# Patient Record
Sex: Male | Born: 1980 | Race: White | Hispanic: Yes | Marital: Single | Smoking: Never smoker
Health system: Southern US, Community
[De-identification: ages and names within clinical notes are randomized; demographics above are authoritative.]

## PROBLEM LIST (undated history)

## (undated) DIAGNOSIS — N200 Calculus of kidney: Secondary | ICD-10-CM

## (undated) HISTORY — PX: APPENDECTOMY: SHX54

---

## 2007-01-16 ENCOUNTER — Observation Stay (HOSPITAL_COMMUNITY): Admission: EM | Admit: 2007-01-16 | Discharge: 2007-01-17 | Payer: Self-pay | Admitting: Emergency Medicine

## 2007-01-16 ENCOUNTER — Encounter (INDEPENDENT_AMBULATORY_CARE_PROVIDER_SITE_OTHER): Payer: Self-pay | Admitting: General Surgery

## 2010-08-09 NOTE — Op Note (Signed)
NAMEHENDRICK, Ronnie Daniel              ACCOUNT NO.:  [id number]   MEDICAL RECORD NO.:  [id number]          Daniel TYPE:  OBV   LOCATION:  A321                          FACILITY:  [facility]   PHYSICIAN:  Dalia Heading, M.D.  DATE OF BIRTH:  March 10, 1981   DATE OF PROCEDURE:  01/16/2007  DATE OF DISCHARGE:  01/17/2007                               OPERATIVE REPORT   PREOPERATIVE DIAGNOSIS:  Acute appendicitis.   POSTOPERATIVE DIAGNOSIS:  Acute appendicitis.   PROCEDURE:  Laparoscopic appendectomy.   SURGEON:  Dr. Franky Macho.   ANESTHESIA:  General endotracheal.   INDICATIONS:  Ronnie Daniel is a 30 year old Hispanic male who presents  with acute appendicitis to Ronnie emergency room.  Ronnie risks and benefits  of Ronnie procedure including bleeding, infection, of Ronnie possibility of an  open procedure were fully explained to Ronnie Daniel through an  interpreter, who gave informed consent.   PROCEDURE NOTE:  Ronnie Daniel is placed in Ronnie supine position.  After  induction of general endotracheal anesthesia, Ronnie abdomen prepped and  draped in Ronnie usual sterile technique with Betadine.  Surgical site  confirmation was performed.   A supraumbilical incision was made down to Ronnie fascia.  A Veress needle  was introduced into Ronnie abdominal cavity and confirmation of placement  was done using Ronnie saline drop test.  Ronnie abdomen was then insufflated  to 16 mmHg pressure.  11 mm trocar was introduced into Ronnie abdominal  cavity under direct visualization without difficulty.  Ronnie Daniel was  placed in deeper Trendelenburg position and 12-mm trocar was placed in  suprapubic region and a 5-mm trocar was placed left lower quadrant  region.  Ronnie appendix was visualized and noted to be acutely inflamed.  Ronnie mesoappendix was divided using Ronnie harmonic scalpel.  Ronnie standard  Endo-GIA was placed across Ronnie base of Ronnie appendix and fired.  Ronnie  appendix was removed using an EndoCatch bag.  Ronnie staple line was  inspected, noted be within normal limits.  All fluid and air were then  evacuated from Ronnie abdominal cavity prior to removal of trocars.   All wounds were irrigated normal saline.  All wounds were checked with  0.5%  Sensorcaine.  Ronnie supraumbilical fascia as well as suprapubic  fascia reapproximated using 0 Vicryl interrupted sutures.  All skin  incisions were closed using staples.  Betadine ointment, dry sterile  dressings were applied.   All tape and needle counts correct at end of Ronnie procedure.  Ronnie Daniel  was extubated in Ronnie operating room, went back to recovery room awake in  stable condition.   COMPLICATIONS:  None.   SPECIMEN:  Appendix.   BLOOD LOSS:  Minimal.      Dalia Heading, M.D.  Electronically Signed     MAJ/MEDQ  D:  01/16/2007  T:  01/17/2007  Job:  [id number]

## 2011-01-04 LAB — COMPREHENSIVE METABOLIC PANEL
ALT: 24
AST: 27
Albumin: 4.5
BUN: 9
CO2: 34 — ABNORMAL HIGH
Creatinine, Ser: 0.86
GFR calc non Af Amer: >60
Glucose, Bld: 151 — ABNORMAL HIGH
Total Bilirubin: 0.9

## 2011-01-04 LAB — DIFFERENTIAL
Eosinophils Relative: 0
Lymphocytes Relative: 2 — ABNORMAL LOW
Lymphs Abs: 0.5 — ABNORMAL LOW
Monocytes Absolute: 0.7
Neutro Abs: 22.1 — ABNORMAL HIGH
Neutrophils Relative %: 95 — ABNORMAL HIGH

## 2011-01-04 LAB — URINE MICROSCOPIC-ADD ON

## 2011-01-04 LAB — URINALYSIS, ROUTINE W REFLEX MICROSCOPIC
Bilirubin Urine: NEGATIVE
Glucose, UA: NEGATIVE
Ketones, ur: NEGATIVE
Protein, ur: 30 — AB
Urobilinogen, UA: 0.2

## 2011-01-04 LAB — CBC
Platelets: 209
WBC: 23.4 — ABNORMAL HIGH

## 2011-01-04 LAB — LIPASE, BLOOD: Lipase: 13

## 2016-04-16 DIAGNOSIS — R945 Abnormal results of liver function studies: Secondary | ICD-10-CM | POA: Insufficient documentation

## 2016-04-16 DIAGNOSIS — K819 Cholecystitis, unspecified: Secondary | ICD-10-CM | POA: Insufficient documentation

## 2016-04-17 ENCOUNTER — Emergency Department (HOSPITAL_COMMUNITY): Payer: Self-pay

## 2016-04-17 ENCOUNTER — Other Ambulatory Visit: Payer: Self-pay | Admitting: Internal Medicine

## 2016-04-17 ENCOUNTER — Emergency Department (HOSPITAL_COMMUNITY)
Admission: EM | Admit: 2016-04-17 | Discharge: 2016-04-17 | Disposition: A | Discharge status: Home / Self Care | Payer: Self-pay | Attending: Emergency Medicine | Admitting: Emergency Medicine

## 2016-04-17 ENCOUNTER — Encounter (HOSPITAL_COMMUNITY): Payer: Self-pay | Admitting: Emergency Medicine

## 2016-04-17 ENCOUNTER — Other Ambulatory Visit: Payer: Self-pay

## 2016-04-17 ENCOUNTER — Telehealth: Payer: Self-pay

## 2016-04-17 DIAGNOSIS — R109 Unspecified abdominal pain: Secondary | ICD-10-CM

## 2016-04-17 DIAGNOSIS — R1011 Right upper quadrant pain: Secondary | ICD-10-CM

## 2016-04-17 DIAGNOSIS — R945 Abnormal results of liver function studies: Secondary | ICD-10-CM

## 2016-04-17 DIAGNOSIS — R7989 Other specified abnormal findings of blood chemistry: Secondary | ICD-10-CM

## 2016-04-17 LAB — COMPREHENSIVE METABOLIC PANEL
ALBUMIN: 4.6 g/dL (ref 3.5–5.0)
ALK PHOS: 104 U/L (ref 38–126)
ALT: 564 U/L — AB (ref 17–63)
AST: 829 U/L — AB (ref 15–41)
Anion gap: 9 (ref 5–15)
BUN: 17 mg/dL (ref 6–20)
CALCIUM: 9.8 mg/dL (ref 8.9–10.3)
CHLORIDE: 101 mmol/L (ref 101–111)
CO2: 29 mmol/L (ref 22–32)
CREATININE: 0.95 mg/dL (ref 0.61–1.24)
GFR calc non Af Amer: >60 mL/min (ref >60)
GLUCOSE: 196 mg/dL — AB (ref 65–99)
Potassium: 3.6 mmol/L (ref 3.5–5.1)
SODIUM: 139 mmol/L (ref 135–145)
Total Bilirubin: 1.6 mg/dL — ABNORMAL HIGH (ref 0.3–1.2)
Total Protein: 8.7 g/dL — ABNORMAL HIGH (ref 6.5–8.1)

## 2016-04-17 LAB — CBC WITH DIFFERENTIAL/PLATELET
BASOS ABS: 0 10*3/uL (ref 0.0–0.1)
BASOS PCT: 0 %
EOS ABS: 0 10*3/uL (ref 0.0–0.7)
EOS PCT: 0 %
HCT: 47.5 % (ref 39.0–52.0)
HEMOGLOBIN: 16.4 g/dL (ref 13.0–17.0)
LYMPHS ABS: 0.9 10*3/uL (ref 0.7–4.0)
Lymphocytes Relative: 6 %
MCH: 30.2 pg (ref 26.0–34.0)
MCHC: 34.5 g/dL (ref 30.0–36.0)
MCV: 87.5 fL (ref 78.0–100.0)
Monocytes Absolute: 0.5 10*3/uL (ref 0.1–1.0)
Monocytes Relative: 4 %
NEUTROS PCT: 90 %
Neutro Abs: 13 10*3/uL — ABNORMAL HIGH (ref 1.7–7.7)
PLATELETS: 250 10*3/uL (ref 150–400)
RBC: 5.43 MIL/uL (ref 4.22–5.81)
RDW: 12.7 % (ref 11.5–15.5)
WBC: 14.5 10*3/uL — AB (ref 4.0–10.5)

## 2016-04-17 LAB — URINALYSIS, ROUTINE W REFLEX MICROSCOPIC
BACTERIA UA: NONE SEEN
GLUCOSE, UA: 50 mg/dL — AB
HGB URINE DIPSTICK: NEGATIVE
KETONES UR: NEGATIVE mg/dL
Leukocytes, UA: NEGATIVE
NITRITE: NEGATIVE
PROTEIN: 100 mg/dL — AB
Specific Gravity, Urine: 1.03 (ref 1.005–1.030)
WBC UA: NONE SEEN WBC/hpf (ref 0–5)
pH: 7 (ref 5.0–8.0)

## 2016-04-17 LAB — ACETAMINOPHEN LEVEL

## 2016-04-17 LAB — PROTIME-INR
INR: 0.98
Prothrombin Time: 13 seconds (ref 11.4–15.2)

## 2016-04-17 LAB — LIPASE, BLOOD: LIPASE: 19 U/L (ref 11–51)

## 2016-04-17 LAB — I-STAT CG4 LACTIC ACID, ED
Lactic Acid, Venous: 2.65 mmol/L (ref 0.5–1.9)
Lactic Acid, Venous: 1.72 mmol/L (ref 0.5–1.9)

## 2016-04-17 MED ORDER — ONDANSETRON HCL 4 MG/2ML IJ SOLN
4.0000 mg | Freq: Once | INTRAMUSCULAR | Status: AC
  Administered 2016-04-17: 4 mg via INTRAVENOUS
  Filled 2016-04-17: qty 2

## 2016-04-17 MED ORDER — DEXTROSE 5 % IV SOLN
INTRAVENOUS | Status: AC
  Filled 2016-04-17: qty 2

## 2016-04-17 MED ORDER — SODIUM CHLORIDE 0.9 % IV BOLUS (SEPSIS)
1000.0000 mL | Freq: Once | INTRAVENOUS | Status: AC
  Administered 2016-04-17: 1000 mL via INTRAVENOUS

## 2016-04-17 MED ORDER — DEXTROSE 5 % IV SOLN
2.0000 g | Freq: Once | INTRAVENOUS | Status: AC
  Administered 2016-04-17: 2 g via INTRAVENOUS
  Filled 2016-04-17: qty 2

## 2016-04-17 MED ORDER — ONDANSETRON HCL 8 MG PO TABS: 8.0000 mg | ORAL_TABLET | ORAL | 0 refills | Status: DC | PRN

## 2016-04-17 MED ORDER — FENTANYL CITRATE (PF) 100 MCG/2ML IJ SOLN
50.0000 ug | Freq: Once | INTRAMUSCULAR | Status: AC
  Administered 2016-04-17: 50 ug via INTRAVENOUS
  Filled 2016-04-17: qty 2

## 2016-04-17 MED ORDER — IOPAMIDOL (ISOVUE-300) INJECTION 61%
100.0000 mL | Freq: Once | INTRAVENOUS | Status: AC | PRN
  Administered 2016-04-17: 100 mL via INTRAVENOUS

## 2016-04-17 NOTE — ED Notes (Signed)
Patient states he does not need anything at this time. 

## 2016-04-17 NOTE — Consult Note (Signed)
SURGICAL CONSULTATION NOTE (initial) - cpt: [phone]  HISTORY OF PRESENT ILLNESS (HPI):  36 y.o. male presented to [facility] ED overnight with abdominal pain that began around 4 pm yesterday afternoon after eating what his family describes as the spanish equivalent of a cheesesteak for lunch yesterday. He describes the pain as RUQ associated with nausea and 5 episodes of non-bloody emesis yesterday. He reports flatus and his pain has improved, but he attributes the relief to a single dose of fentanyl that he received ~6 hours ago at 4:20 am. He also reports that his pain since progressed to his mid-lower abdomen and is now only over his LUQ. He currently denies any fever/chills or N/V and denies any current or recent CP or SOB. Of note, this is the second episode he's experienced of RUQ abdominal pain, the first of which was at 3 am nine days ago on Saturday, 1/13.  Patient is spanish-speaking only, and the history above is obtained with the help of a translator, his spanish- and english-speaking family at bedside.  Surgery is consulted by ED physician Dr. Manus Gunningancour in this context for evaluation and management of gallstones.  PAST MEDICAL HISTORY (PMH):  History reviewed. No pertinent past medical history.   PAST SURGICAL HISTORY (PSH):  Past Surgical History:  Procedure Laterality Date  . APPENDECTOMY       MEDICATIONS:  Prior to Admission medications   Not on File     ALLERGIES:  No Known Allergies   SOCIAL HISTORY:  Social History   Social History  . Marital status: Single    Spouse name: N/A  . Number of children: N/A  . Years of education: N/A   Occupational History  . Not on file.   Social History Main Topics  . Smoking status: Never Smoker  . Smokeless tobacco: Never Used  . Alcohol use No     Comment: Former drinker-stopped 3 months ago  . Drug use: No  . Sexual activity: Not on file   Other Topics Concern  . Not on file   Social History Narrative  . No narrative on  file    The patient currently resides (home / rehab facility / nursing home): Home  The patient normally is (ambulatory / bedbound): Ambulatory   FAMILY HISTORY:  No family history on file.   REVIEW OF SYSTEMS:  Constitutional: denies weight loss, fever, chills, or sweats  Eyes: denies any other vision changes, history of eye injury  ENT: denies sore throat, hearing problems  Respiratory: denies shortness of breath, wheezing  Cardiovascular: denies chest pain, palpitations  Gastrointestinal: abdominal pain, N/V, and bowel function as per HPI Genitourinary: denies burning with urination or urinary frequency Musculoskeletal: denies any other joint pains or cramps  Skin: denies any other rashes or skin discolorations  Neurological: denies any other headache, dizziness, weakness  Psychiatric: denies any other depression, anxiety   All other review of systems were negative   VITAL SIGNS:  Temp:  [98.1 F (36.7 C)-98.2 F (36.8 C)] 98.2 F (36.8 C) (01/22 0412) Pulse Rate:  [70-94] 74 (01/22 0930) Resp:  [16-18] 17 (01/22 0600) BP: (101-138)/(69-88) 113/78 (01/22 0930) SpO2:  [97 %-100 %] 99 % (01/22 0930) Weight:  [65.5 kg (144 lb 6.4 oz)] 65.5 kg (144 lb 6.4 oz) (01/22 0127)     Height: 5\' 6"  (167.6 cm) Weight: 65.5 kg (144 lb 6.4 oz) BMI (Calculated): 23.4   INTAKE/OUTPUT:  This shift: No intake/output data recorded.  Last 2 shifts: @IOLAST2SHIFTS @  PHYSICAL EXAM:  Constitutional:  -- Normal body habitus  -- Awake, alert, and oriented x3  Eyes:  -- Pupils equally round and reactive to light  -- No scleral icterus  Ear, nose, and throat:  -- No jugular venous distension  Pulmonary:  -- No crackles  -- Equal breath sounds bilaterally -- Breathing non-labored at rest Cardiovascular:  -- S1, S2 present  -- No pericardial rubs Gastrointestinal:  -- Abdomen soft with only mild LUQ tenderness to palpation, nondistended, no guarding/rebound  -- No abdominal masses  appreciated, pulsatile or otherwise  Musculoskeletal and Integumentary:  -- Wounds or skin discoloration: None appreciated -- Extremities: B/L UE and LE FROM, no edema  Neurologic:  -- Motor function: intact and symmetric -- Sensation: intact and symmetric  Labs:  CBC Latest Ref Rng & Units 04/17/2016 01/16/2007  WBC 4.0 - 10.5 K/uL 14.5(H) 23.4(H)  Hemoglobin 13.0 - 17.0 g/dL 16.1 09.6  Hematocrit 04.5 - 52.0 % 47.5 45.5  Platelets 150 - 400 K/uL 250 209   CMP Latest Ref Rng & Units 04/17/2016 01/16/2007  Glucose 65 - 99 mg/dL 409(W) 119(J)  BUN 6 - 20 mg/dL 17 9  Creatinine 4.78 - 1.24 mg/dL 2.95 6.21  Sodium 308 - 145 mmol/L 139 140  Potassium 3.5 - 5.1 mmol/L 3.6 3.3(L)  Chloride 101 - 111 mmol/L 101 102  CO2 22 - 32 mmol/L 29 34(H)  Calcium 8.9 - 10.3 mg/dL 9.8 9.6  Total Protein 6.5 - 8.1 g/dL 6.5(H) 7.6  Total Bilirubin 0.3 - 1.2 mg/dL 8.4(O) 0.9  Alkaline Phos 38 - 126 U/L 104 76  AST 15 - 41 U/L 829(H) 27  ALT 17 - 63 U/L 564(H) 24    Imaging studies:  CT Abdomen and Pelvis with Contrast (04/17/2016) There is apparent fatty infiltration of the liver. No intrahepatic biliary ductal dilatation. There is stone within the gallbladder. No pericholecystic fluid or evidence of acute cholecystitis by CT. Ultrasound may provide better evaluation of the gallbladder if clinically indicated.  There is mild to moderate stool within the ascending and transverse colon. There is thickened appearance of the descending colon and sigmoid likely related to underdistention. Mild colitis is less likely but not entirely excluded. Clinical correlation is recommended. There is no bowel obstruction. Appendectomy.  Limited RUQ Abdominal Ultrasound (04/17/2016) Multiple gallstones measuring up to 7 mm in maximum diameter each. No gallbladder wall thickening or pericholecystic fluid. The patient was not focally tender over the gallbladder. Common bile duct diameter measures 3.9 mm.  Assessment/Plan:  (ICD-10's: K53.20) 36 y.o. male with improved albeit variable abdominal pain, progressing from RUQ to suprapubic to LUQ, hyperbilirubinemia, and elevation of hepatic transaminases of unclear etiology associated with cholelithiasis without radiographic or sonographic evidence of cholecystitis and no evidence of pancreatitis, possible mild colitis, complicated by lack of routine medical care or health insurance.   - GI consultation recommended with likely repeat LFT's, follow-up hepatitis panel  - symptomatic cholelithiasis could explain RUQ abdominal pain and even N/V, but does not explain elevated LFT's or variable abdominal pain   - can follow-up outpatient in surgery office pending GI workup for elevated LFT's, possible elective cholecystectomy  All of the above findings and recommendations were discussed with the patient, his family, and ED physician, and all of patient's and his family's questions were answered to their expressed satisfaction.  Thank you for the opportunity to participate in this patient's care.   -- Scherrie Gerlach Earlene Plater, MD, RPVI [place]: [facility]  [facility] Office: [phone]

## 2016-04-17 NOTE — ED Notes (Signed)
Patient transported to Ultrasound 

## 2016-04-17 NOTE — Telephone Encounter (Signed)
noted 

## 2016-04-17 NOTE — Telephone Encounter (Signed)
Per RMR- pt needs ov on Wednesday for abd pain and elevated LFTs. Pt in ED now and having labs drawn. Pt also needs HFP and INR done stat prior to ov. Lab orders done. Pt scheduled for ov on 02/17/17 at 11:30 with [facility]. Called the ED and spoke with Rosey Batheresa, she will inform pt of labs and ov date and time.  Per RMR- pt does not speak english, Misty StanleyStacey, can you make sure we have an interpreter come to the ov?

## 2016-04-17 NOTE — Telephone Encounter (Signed)
MARKED FOR INTERPRETER

## 2016-04-17 NOTE — ED Triage Notes (Signed)
Pt with R flank pain that travels to his R lower abdomen. Pt states he has been having nausea, vomiting, and diarrhea as well since 1600 yesterday.

## 2016-04-17 NOTE — Discharge Instructions (Signed)
Take Zofran as prescribed as needed for nausea.  You are to follow-up with Dr. Jena Gaussourk in the gastroenterology clinic Wednesday at 11:30.  You're also to have blood drawn tomorrow morning at the lab.  Return to the emergency department in the meantime if you develop severe abdominal pain, bloody stools, high fevers, or other new and concerning symptoms.

## 2016-04-17 NOTE — ED Provider Notes (Signed)
[facility] DEPT Provider Note   CSN: [id number] Arrival date & time: 04/16/16  2326     History   Chief Complaint Chief Complaint  Patient presents with  . Back Pain    HPI Ronnie Daniel is a 36 y.o. male.  Level V caveat for language barrier. Patient presents with right-sided mid back and flank pain and abdominal pain onset around 4 PM yesterday that has been constant. Associated multiple episodes of nausea and vomiting. Contrary to triage note denies diarrhea. No fever. No recent travel or sick contacts. Patient born in [place] but has been in the [place] for 13 years. Denies any recent drug use. Previous appendectomy. He's never had this pain before. The pain is severe and reminds him of when he had his appendix removed but is in a different location. Denies any pain with urination or testicular pain. No hematuria.   The history is provided by the patient and a relative. The history is limited by a language barrier.  Back Pain   Associated symptoms include abdominal pain. Pertinent negatives include no chest pain, no fever, no headaches, no dysuria and no weakness.    History reviewed. No pertinent past medical history.  There are no active problems to display for this patient.   Past Surgical History:  Procedure Laterality Date  . APPENDECTOMY         Home Medications    Prior to Admission medications   Not on File    Family History No family history on file.  Social History Social History  Substance Use Topics  . Smoking status: Never Smoker  . Smokeless tobacco: Never Used  . Alcohol use No     Comment: Former drinker-stopped 3 months ago     Allergies   Patient has no known allergies.   Review of Systems Review of Systems  Constitutional: Positive for activity change and appetite change. Negative for fever.  Respiratory: Negative for cough, chest tightness and shortness of breath.   Cardiovascular: Negative for chest pain.  Gastrointestinal:  Positive for abdominal pain, nausea and vomiting. Negative for diarrhea.  Genitourinary: Positive for flank pain. Negative for dysuria and hematuria.  Musculoskeletal: Positive for back pain.  Neurological: Negative for dizziness, weakness and headaches.   A complete 10 system review of systems was obtained and all systems are negative except as noted in the HPI and PMH.    Physical Exam Updated Vital Signs BP 138/77 (BP Location: Left Arm)   Pulse 80   Temp 98.1 F (36.7 C) (Oral)   Resp 18   Ht 5\' 6"  (1.676 m)   Wt 144 lb 6.4 oz (65.5 kg)   SpO2 99%   BMI 23.31 kg/m   Physical Exam  Constitutional: He is oriented to person, place, and time. He appears well-developed and well-nourished. No distress.  HENT:  Head: Normocephalic and atraumatic.  Mouth/Throat: Oropharynx is clear and moist. No oropharyngeal exudate.  Eyes: Conjunctivae and EOM are normal. Pupils are equal, round, and reactive to light.  Neck: Normal range of motion. Neck supple.  No meningismus.  Cardiovascular: Normal rate, regular rhythm, normal heart sounds and intact distal pulses.   No murmur heard. Pulmonary/Chest: Effort normal and breath sounds normal. No respiratory distress.  Abdominal: Soft. There is tenderness. There is no rebound and no guarding.  TTP RUQ and epigastrium.  Musculoskeletal: Normal range of motion. He exhibits tenderness. He exhibits no edema.  R paraspinal tenderness  Neurological: He is alert and oriented to person, place,  and time. No cranial nerve deficit. He exhibits normal muscle tone. Coordination normal.  No ataxia on finger to nose bilaterally. No pronator drift. 5/5 strength throughout. CN 2-12 intact.Equal grip strength. Sensation intact.   Skin: Skin is warm.  Psychiatric: He has a normal mood and affect. His behavior is normal.  Nursing note and vitals reviewed.    ED Treatments / Results  Labs (all labs ordered are listed, but only abnormal results are  displayed) Labs Reviewed  CBC WITH DIFFERENTIAL/PLATELET - Abnormal; Notable for the following:       Result Value   WBC 14.5 (*)    Neutro Abs 13.0 (*)    All other components within normal limits  COMPREHENSIVE METABOLIC PANEL - Abnormal; Notable for the following:    Glucose, Bld 196 (*)    Total Protein 8.7 (*)    AST 829 (*)    ALT 564 (*)    Total Bilirubin 1.6 (*)    All other components within normal limits  URINALYSIS, ROUTINE W REFLEX MICROSCOPIC - Abnormal; Notable for the following:    Color, Urine AMBER (*)    APPearance CLOUDY (*)    Glucose, UA 50 (*)    Bilirubin Urine SMALL (*)    Protein, ur 100 (*)    All other components within normal limits  ACETAMINOPHEN LEVEL - Abnormal; Notable for the following:    Acetaminophen (Tylenol), Serum <10 (*)    All other components within normal limits  I-STAT CG4 LACTIC ACID, ED - Abnormal; Notable for the following:    Lactic Acid, Venous 2.65 (*)    All other components within normal limits  LIPASE, BLOOD  HEPATITIS PANEL, ACUTE  I-STAT CG4 LACTIC ACID, ED    EKG  EKG Interpretation None       Radiology Ct Abdomen Pelvis W Contrast  Result Date: 04/17/2016 CLINICAL DATA:  35 year old male with elevated LFTs. EXAM: CT ABDOMEN AND PELVIS WITH CONTRAST TECHNIQUE: Multidetector CT imaging of the abdomen and pelvis was performed using the standard protocol following bolus administration of intravenous contrast. CONTRAST:  ISOVUE-300 IOPAMIDOL (ISOVUE-300) INJECTION 61% COMPARISON:  Abdominal CT dated 01/16/2007 FINDINGS: Lower chest: Minimal bibasilar dependent atelectatic changes. The visualized lung bases are otherwise clear. There is no intra-abdominal free air or free fluid. Hepatobiliary: There is apparent fatty infiltration of the liver. No intrahepatic biliary ductal dilatation. There is stone within the gallbladder. No pericholecystic fluid or evidence of acute cholecystitis by CT. Ultrasound may provide  better evaluation of the gallbladder if clinically indicated. Pancreas: Unremarkable. No pancreatic ductal dilatation or surrounding inflammatory changes. Spleen: Normal in size without focal abnormality. Adrenals/Urinary Tract: Adrenal glands are unremarkable. Kidneys are normal, without renal calculi, focal lesion, or hydronephrosis. Bladder is unremarkable. Stomach/Bowel: There is mild to moderate stool within the ascending and transverse colon. There is thickened appearance of the descending colon and sigmoid likely related to underdistention. Mild colitis is less likely but not entirely excluded. Clinical correlation is recommended. There is no bowel obstruction. Appendectomy. Vascular/Lymphatic: No significant vascular findings are present. No enlarged abdominal or pelvic lymph nodes. Reproductive: The prostate and seminal vesicles are grossly unremarkable. Other: None Musculoskeletal: No acute or significant osseous findings. IMPRESSION: Fatty infiltration of the liver. Cholelithiasis. Ultrasound may provide better evaluation of the gallbladder clinically indicated. Underdistention versus less likely mild colitis of the descending colon. Clinical correlation is recommended. No bowel obstruction. Electronically Signed   By: Elgie Collard M.D.   On: 04/17/2016 06:06  Procedures Procedures (including critical care time)  Medications Ordered in ED Medications  sodium chloride 0.9 % bolus 1,000 mL (not administered)  ondansetron (ZOFRAN) injection 4 mg (not administered)  iopamidol (ISOVUE-300) 61 % injection 100 mL (not administered)     Initial Impression / Assessment and Plan / ED Course  I have reviewed the triage vital signs and the nursing notes.  Pertinent labs & imaging results that were available during my care of the patient were reviewed by me and considered in my medical decision making (see chart for details).     Right-sided upper abdominal pain and mid back pain with nausea  and vomiting. Afebrile. No testicular pain.  Patient used to be a heavy drinker but is not drunk anything in 3 months. Labs show significant LFT elevation and transaminitis.lipase normal.  Labs also with lactic acidosis and leukocytosis and hyperglycemia. CT scan obtained because ultrasound is not available. This does show gallstones but no pericholecystic fluid or gallbladder wall thickening.  Concern for cholecystitis discussed with Dr. Earlene Plater of surgery. He agrees this is concerning but would like right upper quadrant ultrasound to be obtained to make sure there is no stone in the common bile duct.  The patient remains nothing by mouth. Given IV Rocephin. Ultrasound ordered. Pending at time of shift change. Dr. Judd Lien to assume care.   Final Clinical Impressions(s) / ED Diagnoses   Final diagnoses:  RUQ pain  Cholecystitis    New Prescriptions New Prescriptions   No medications on file     Glynn Octave, MD 04/17/16 [phone]

## 2016-04-18 ENCOUNTER — Other Ambulatory Visit (HOSPITAL_COMMUNITY)
Admission: RE | Admit: 2016-04-18 | Discharge: 2016-04-18 | Disposition: A | Discharge status: Home / Self Care | Payer: Self-pay | Source: Ambulatory Visit | Attending: Internal Medicine | Admitting: Internal Medicine

## 2016-04-18 ENCOUNTER — Telehealth: Payer: Self-pay | Admitting: Internal Medicine

## 2016-04-18 DIAGNOSIS — R109 Unspecified abdominal pain: Secondary | ICD-10-CM | POA: Insufficient documentation

## 2016-04-18 LAB — HEPATIC FUNCTION PANEL
ALT: 471 U/L — AB (ref 17–63)
AST: 206 U/L — ABNORMAL HIGH (ref 15–41)
Albumin: 4.1 g/dL (ref 3.5–5.0)
Alkaline Phosphatase: 132 U/L — ABNORMAL HIGH (ref 38–126)
BILIRUBIN DIRECT: 0.2 mg/dL (ref 0.1–0.5)
BILIRUBIN INDIRECT: 0.8 mg/dL (ref 0.3–0.9)
Total Bilirubin: 1 mg/dL (ref 0.3–1.2)
Total Protein: 7.8 g/dL (ref 6.5–8.1)

## 2016-04-18 LAB — HEPATITIS PANEL, ACUTE
HCV Ab: <0.1 s/co ratio (ref 0.0–0.9)
Hep A IgM: NEGATIVE
Hep B C IgM: NEGATIVE
Hepatitis B Surface Ag: NEGATIVE

## 2016-04-18 LAB — PROTIME-INR
INR: 0.92
PROTHROMBIN TIME: 12.3 s (ref 11.4–15.2)

## 2016-04-18 NOTE — Telephone Encounter (Signed)
H[facility] saying that the [facility] was there for lab work and the order wasn't in the system. Fax [phone] ( I told her that the patient has OV here tomorrow, but she said RMR had seen patient in the hospital and wanted labs done today)

## 2016-04-18 NOTE — Telephone Encounter (Signed)
Lab order has been faxed to the hospital lab. It was originally sent to the [facility] lab.

## 2016-04-19 ENCOUNTER — Ambulatory Visit (INDEPENDENT_AMBULATORY_CARE_PROVIDER_SITE_OTHER): Payer: Self-pay | Admitting: Gastroenterology

## 2016-04-19 ENCOUNTER — Encounter: Payer: Self-pay | Admitting: Gastroenterology

## 2016-04-19 VITALS — BP 130/81 | HR 67 | Temp 97.8°F | Ht 63.0 in | Wt 145.6 lb

## 2016-04-19 DIAGNOSIS — R945 Abnormal results of liver function studies: Secondary | ICD-10-CM | POA: Insufficient documentation

## 2016-04-19 DIAGNOSIS — R1011 Right upper quadrant pain: Secondary | ICD-10-CM | POA: Insufficient documentation

## 2016-04-19 DIAGNOSIS — R7989 Other specified abnormal findings of blood chemistry: Secondary | ICD-10-CM | POA: Insufficient documentation

## 2016-04-19 NOTE — Progress Notes (Signed)
Patient seen.

## 2016-04-19 NOTE — Patient Instructions (Signed)
1. Please have your labs done on Monday. If you liver numbers are near normal, we will send you to the surgeon for gallbladder surgery.  2. Please eat a low fat diet until then to try and keep gallbladder from acting up.   Low-Fat Diet for Pancreatitis or Gallbladder Conditions A low-fat diet can be helpful if you have pancreatitis or a gallbladder condition. With these conditions, your pancreas and gallbladder have trouble digesting fats. A healthy eating plan with less fat will help rest your pancreas and gallbladder and reduce your symptoms. What do I need to know about this diet?  Eat a low-fat diet.  Reduce your fat intake to less than 20-30% of your total daily calories. This is less than 50-60 g of fat per day.  Remember that you need some fat in your diet. Ask your dietician what your daily goal should be.  Choose nonfat and low-fat healthy foods. Look for the words "nonfat," "low fat," or "fat free."  As a guide, look on the label and choose foods with less than 3 g of fat per serving. Eat only one serving.  Avoid alcohol.  Do not smoke. If you need help quitting, talk with your health care provider.  Eat small frequent meals instead of three large heavy meals. What foods can I eat? Grains  Include healthy grains and starches such as potatoes, wheat bread, fiber-rich cereal, and brown rice. Choose whole grain options whenever possible. In adults, whole grains should account for 45-65% of your daily calories. Fruits and Vegetables  Eat plenty of fruits and vegetables. Fresh fruits and vegetables add fiber to your diet. Meats and Other Protein Sources  Eat lean meat such as chicken and pork. Trim any fat off of meat before cooking it. Eggs, fish, and beans are other sources of protein. In adults, these foods should account for 10-35% of your daily calories. Dairy  Choose low-fat milk and dairy options. Dairy includes fat and protein, as well as calcium. Fats and Oils  Limit  high-fat foods such as fried foods, sweets, baked goods, sugary drinks. Other  Creamy sauces and condiments, such as mayonnaise, can add extra fat. Think about whether or not you need to use them, or use smaller amounts or low fat options. What foods are not recommended?  High fat foods, such as:  [employer] Corporation[employer]aked goods.  Ice cream.  [place] toast.  Sweet rolls.  Pizza.  Cheese bread.  Foods covered with batter, butter, creamy sauces, or cheese.  Fried foods.  Sugary drinks and desserts.  Foods that cause gas or bloating This information is not intended to replace advice given to you by your health care provider. Make sure you discuss any questions you have with your health care provider. Document Released: 03/18/2013 Document Revised: 08/19/2015 Document Reviewed: 02/24/2013 Elsevier Interactive Patient Education  2017 [employer]

## 2016-04-19 NOTE — Progress Notes (Signed)
Primary Care Physician:  No PCP Per Patient  Primary Gastroenterologist:  Roetta Sessions, MD   Chief Complaint  Patient presents with  . Abdominal Pain    HPI:  Brayant Dorr is a 36 y.o. male Hispanic speaking male presents for further evaluation of RUQ pain. Patient is here with niece who provides interpretation. A formal interpreter presented to the office but left prior to visit since the niece speaks [place].   Patient has had at least three episodes of postprandial RUQ pain radiating into the right back/shoulder over the past one year. Two episodes in the last couple of weeks. Patient describes one episode lasting 30 minutes after a meal and went away. The last episode prompted ED visit on 04/17/16. Within 45 minutes of eating steak and cheese he developed acute onset ruq pain/into the right back/shoulder associated with five episodes of vomiting. In between episodes he denies heartburn or abd pain. BM regular. No melena, brbpr. No NSAIDS/ASA. Currently patient is without abd pain since his ED visit.   RUQ u/s showed multiple gallstones measuring up to 7mm. No gb wall thickening or pericholecystic fluid. CBD 3.46mm.CT A/P as outlined below. Colon likely underdistended as cause of ?thickening appearance of descending and sigmoid colon.   Tbili 1.6, AP 104, AST 829, ALT 564, WBC 14.5, INR 0.98.  Hep B surf Ag neg, HCV Ab Neg, Hep A IgM neg, Hep B C IgM neg.  Rechecked numbers yesterday, AST 206, ALT 471, AP 132, Tbili 1  Patient seen by general surgery while in ED. They recommended GI evaluation prior to consideration of cholecystectomy given abnormal LFTs and variable abdominal pain.   No current outpatient prescriptions on file.   No current facility-administered medications for this visit.     Allergies as of 04/19/2016  . (No Known Allergies)    History reviewed. No pertinent past medical history.  Past Surgical History:  Procedure Laterality Date  . APPENDECTOMY       Family History  Problem Relation Age of Onset  . Cirrhosis Brother     etoh  . Diabetes Brother   . Colon cancer Neg Hx     Social History   Social History  . Marital status: Single    Spouse name: N/A  . Number of children: 0  . Years of education: N/A   Occupational History  . concrete work    Social History Main Topics  . Smoking status: Never Smoker  . Smokeless tobacco: Never Used  . Alcohol use No     Comment: Former drinker-stopped 3 months ago  . Drug use: No  . Sexual activity: Not on file   Other Topics Concern  . Not on file   Social History Narrative  . No narrative on file      ROS:  General: Negative for anorexia, weight loss, fever, chills, fatigue, weakness. Eyes: Negative for vision changes.  ENT: Negative for hoarseness, difficulty swallowing , nasal congestion. CV: Negative for chest pain, angina, palpitations, dyspnea on exertion, peripheral edema.  Respiratory: Negative for dyspnea at rest, dyspnea on exertion, cough, sputum, wheezing.  GI: See history of present illness. GU:  Negative for dysuria, hematuria, urinary incontinence, urinary frequency, nocturnal urination.  MS: Negative for joint pain, low back pain.  Derm: Negative for rash or itching.  Neuro: Negative for weakness, abnormal sensation, seizure, frequent headaches, memory loss, confusion.  Psych: Negative for anxiety, depression, suicidal ideation, hallucinations.  Endo: Negative for unusual weight change.  Heme: Negative for bruising  or bleeding. Allergy: Negative for rash or hives.    Physical Examination:  BP 130/81   Pulse 67   Temp 97.8 F (36.6 C) (Oral)   Ht 5\' 3"  (1.6 m)   Wt 145 lb 9.6 oz (66 kg)   BMI 25.79 kg/m    General: Well-nourished, well-developed in no acute distress.  Head: Normocephalic, atraumatic.   Eyes: Conjunctiva pink, no icterus. Mouth: Oropharyngeal mucosa moist and pink , no lesions erythema or exudate. Neck: Supple without  thyromegaly, masses, or lymphadenopathy.  Lungs: Clear to auscultation bilaterally.  Heart: Regular rate and rhythm, no murmurs rubs or gallops.  Abdomen: Bowel sounds are normal, nontender, nondistended, no hepatosplenomegaly or masses, no abdominal bruits or    hernia , no rebound or guarding.   Rectal: not performed Extremities: No lower extremity edema. No clubbing or deformities.  Neuro: Alert and oriented x 4 , grossly normal neurologically.  Skin: Warm and dry, no rash or jaundice.   Psych: Alert and cooperative, normal mood and affect.  Labs: Lab Results  Component Value Date   ALT 471 (H) 04/18/2016   AST 206 (H) 04/18/2016   ALKPHOS 132 (H) 04/18/2016   BILITOT 1.0 04/18/2016   Lab Results  Component Value Date   CREATININE 0.95 04/17/2016   BUN 17 04/17/2016   NA 139 04/17/2016   K 3.6 04/17/2016   CL 101 04/17/2016   CO2 29 04/17/2016   Lab Results  Component Value Date   WBC 14.5 (H) 04/17/2016   HGB 16.4 04/17/2016   HCT 47.5 04/17/2016   MCV 87.5 04/17/2016   PLT 250 04/17/2016   Lab Results  Component Value Date   INR 0.92 04/18/2016   INR 0.98 04/17/2016     Imaging Studies: Ct Abdomen Pelvis W Contrast  Result Date: 04/17/2016 CLINICAL DATA:  36 year old male with elevated LFTs. EXAM: CT ABDOMEN AND PELVIS WITH CONTRAST TECHNIQUE: Multidetector CT imaging of the abdomen and pelvis was performed using the standard protocol following bolus administration of intravenous contrast. CONTRAST:  ISOVUE-300 IOPAMIDOL (ISOVUE-300) INJECTION 61% COMPARISON:  Abdominal CT dated 01/16/2007 FINDINGS: Lower chest: Minimal bibasilar dependent atelectatic changes. The visualized lung bases are otherwise clear. There is no intra-abdominal free air or free fluid. Hepatobiliary: There is apparent fatty infiltration of the liver. No intrahepatic biliary ductal dilatation. There is stone within the gallbladder. No pericholecystic fluid or evidence of acute cholecystitis  by CT. Ultrasound may provide better evaluation of the gallbladder if clinically indicated. Pancreas: Unremarkable. No pancreatic ductal dilatation or surrounding inflammatory changes. Spleen: Normal in size without focal abnormality. Adrenals/Urinary Tract: Adrenal glands are unremarkable. Kidneys are normal, without renal calculi, focal lesion, or hydronephrosis. Bladder is unremarkable. Stomach/Bowel: There is mild to moderate stool within the ascending and transverse colon. There is thickened appearance of the descending colon and sigmoid likely related to underdistention. Mild colitis is less likely but not entirely excluded. Clinical correlation is recommended. There is no bowel obstruction. Appendectomy. Vascular/Lymphatic: No significant vascular findings are present. No enlarged abdominal or pelvic lymph nodes. Reproductive: The prostate and seminal vesicles are grossly unremarkable. Other: None Musculoskeletal: No acute or significant osseous findings. IMPRESSION: Fatty infiltration of the liver. Cholelithiasis. Ultrasound may provide better evaluation of the gallbladder clinically indicated. Underdistention versus less likely mild colitis of the descending colon. Clinical correlation is recommended. No bowel obstruction. Electronically Signed   By: Elgie Collard M.D.   On: 04/17/2016 06:06   US Abdomen Limited Ruq  Result Date: 04/17/2016  CLINICAL DATA:  Right upper quadrant abdominal pain and nausea for the past 2 days. EXAM: US ABDOMEN LIMITED - RIGHT UPPER QUADRANT COMPARISON:  Abdomen CT dated 04/17/2016. FINDINGS: Gallbladder: Multiple gallstones measuring up to 7 mm in maximum diameter each. No gallbladder wall thickening or pericholecystic fluid. The patient was not focally tender over the gallbladder. Common bile duct: Diameter: 3.9 mm Liver: No focal lesion identified. Within normal limits in parenchymal echogenicity. IMPRESSION: Cholelithiasis without evidence of cholecystitis.  Electronically Signed   By: Beckie SaltsSteven  Reid M.D.   On: 04/17/2016 08:34

## 2016-04-20 NOTE — Assessment & Plan Note (Addendum)
36 y/o Hispanic male with recurrent biliary colic. At least two episodes in the past couple of weeks and similar episode last year. Most recent episode associated with abnormal LFTs. Viral markers negative. LFts improved.  Symptoms resolved fairly quickly. Suspect patient passed a stone. Suspect colon finding on CT artifactual.   Discussed with patient, we will check his labs again on Monday. Will include HgbA1C given elevated glucose couple of occasions. Patient does not have PCP. If near normal, then would make arrangements for patient to see surgeon regarding cholecystectomy. In the interim, if recurrent unrelenting pain, go to the ER.

## 2016-04-21 NOTE — Progress Notes (Signed)
No pcp per patient 

## 2016-04-24 ENCOUNTER — Other Ambulatory Visit (HOSPITAL_COMMUNITY)
Admission: RE | Admit: 2016-04-24 | Discharge: 2016-04-24 | Disposition: A | Discharge status: Home / Self Care | Payer: Self-pay | Source: Ambulatory Visit | Attending: Gastroenterology | Admitting: Gastroenterology

## 2016-04-24 DIAGNOSIS — R7989 Other specified abnormal findings of blood chemistry: Secondary | ICD-10-CM | POA: Insufficient documentation

## 2016-04-24 DIAGNOSIS — R1011 Right upper quadrant pain: Secondary | ICD-10-CM | POA: Insufficient documentation

## 2016-04-24 LAB — COMPREHENSIVE METABOLIC PANEL
ALT: 98 U/L — AB (ref 17–63)
AST: 27 U/L (ref 15–41)
Albumin: 4.3 g/dL (ref 3.5–5.0)
Alkaline Phosphatase: 88 U/L (ref 38–126)
Anion gap: 7 (ref 5–15)
BILIRUBIN TOTAL: 0.6 mg/dL (ref 0.3–1.2)
BUN: 13 mg/dL (ref 6–20)
CHLORIDE: 101 mmol/L (ref 101–111)
CO2: 30 mmol/L (ref 22–32)
CREATININE: 0.99 mg/dL (ref 0.61–1.24)
Calcium: 9.3 mg/dL (ref 8.9–10.3)
GFR calc Af Amer: >60 mL/min (ref >60)
GLUCOSE: 106 mg/dL — AB (ref 65–99)
POTASSIUM: 3.9 mmol/L (ref 3.5–5.1)
Sodium: 138 mmol/L (ref 135–145)
TOTAL PROTEIN: 7.8 g/dL (ref 6.5–8.1)

## 2016-04-24 LAB — LIPASE, BLOOD: LIPASE: 21 U/L (ref 11–51)

## 2016-04-24 NOTE — Progress Notes (Signed)
Please let patient know (call niece [name] - see hippa form for phone number) with results as she provides interpretation for patient) that his LFTs are near normal. HgbA1C is pending.   Needs referral to general surgery to get his gallbladder out. Please send copy of my last OV note and these labs.   Repeat LFTs in 2 weeks.

## 2016-04-25 ENCOUNTER — Other Ambulatory Visit: Payer: Self-pay | Admitting: Gastroenterology

## 2016-04-25 ENCOUNTER — Other Ambulatory Visit: Payer: Self-pay

## 2016-04-25 DIAGNOSIS — R945 Abnormal results of liver function studies: Secondary | ICD-10-CM

## 2016-04-25 DIAGNOSIS — R7989 Other specified abnormal findings of blood chemistry: Secondary | ICD-10-CM

## 2016-04-25 DIAGNOSIS — R1011 Right upper quadrant pain: Secondary | ICD-10-CM

## 2016-04-25 LAB — HEMOGLOBIN A1C
HEMOGLOBIN A1C: 5.8 % — AB (ref 4.8–5.6)
MEAN PLASMA GLUCOSE: 120 mg/dL

## 2016-04-25 NOTE — Progress Notes (Signed)
HgbA1C slightly elevated. Patient needs to establish care with PCP if he hasn't already. He is classified as pre-diabetes.

## 2016-05-02 ENCOUNTER — Telehealth: Payer: Self-pay

## 2016-05-02 NOTE — Telephone Encounter (Signed)
Kell from Dr.Jenkins called and said that he no showed for his appointment

## 2016-05-03 NOTE — Telephone Encounter (Signed)
Thanks

## 2016-05-03 NOTE — Telephone Encounter (Signed)
Please contact patient and/or niece [name] - HIPPA form has phone number, to find out why he missed appt and if he wants to be referred back to surgery.   May have been miscommunication given language barrier????

## 2016-05-03 NOTE — Telephone Encounter (Signed)
Pt does not have insurance and he could not pay for the surgery up front because he does not have $800.00.I am going to mail out [employer] [employer] paper work to them.

## 2017-02-11 ENCOUNTER — Observation Stay (HOSPITAL_COMMUNITY)
Admission: EM | Admit: 2017-02-11 | Discharge: 2017-02-12 | Disposition: A | Discharge status: Home / Self Care | Payer: Self-pay | Attending: General Surgery | Admitting: General Surgery

## 2017-02-11 ENCOUNTER — Encounter (HOSPITAL_COMMUNITY): Payer: Self-pay | Admitting: Emergency Medicine

## 2017-02-11 DIAGNOSIS — Z23 Encounter for immunization: Secondary | ICD-10-CM | POA: Insufficient documentation

## 2017-02-11 DIAGNOSIS — K8012 Calculus of gallbladder with acute and chronic cholecystitis without obstruction: Principal | ICD-10-CM | POA: Insufficient documentation

## 2017-02-11 DIAGNOSIS — K8000 Calculus of gallbladder with acute cholecystitis without obstruction: Secondary | ICD-10-CM

## 2017-02-11 DIAGNOSIS — K819 Cholecystitis, unspecified: Secondary | ICD-10-CM | POA: Diagnosis present

## 2017-02-11 DIAGNOSIS — K8050 Calculus of bile duct without cholangitis or cholecystitis without obstruction: Secondary | ICD-10-CM

## 2017-02-11 HISTORY — DX: Calculus of kidney: N20.0

## 2017-02-11 NOTE — ED Triage Notes (Signed)
Pt with RU abdominal pain that radiates to R flank area.

## 2017-02-12 ENCOUNTER — Encounter (HOSPITAL_COMMUNITY): Payer: Self-pay | Admitting: *Deleted

## 2017-02-12 ENCOUNTER — Emergency Department (HOSPITAL_COMMUNITY): Payer: Self-pay

## 2017-02-12 ENCOUNTER — Encounter (HOSPITAL_COMMUNITY)
Admission: EM | Disposition: A | Discharge status: Home / Self Care | Payer: Self-pay | Source: Home / Self Care | Attending: General Surgery

## 2017-02-12 ENCOUNTER — Inpatient Hospital Stay (HOSPITAL_COMMUNITY): Payer: Self-pay | Admitting: Certified Registered Nurse Anesthetist

## 2017-02-12 ENCOUNTER — Other Ambulatory Visit: Payer: Self-pay

## 2017-02-12 ENCOUNTER — Observation Stay (HOSPITAL_COMMUNITY): Payer: Self-pay

## 2017-02-12 DIAGNOSIS — K8050 Calculus of bile duct without cholangitis or cholecystitis without obstruction: Secondary | ICD-10-CM | POA: Diagnosis present

## 2017-02-12 DIAGNOSIS — K819 Cholecystitis, unspecified: Secondary | ICD-10-CM | POA: Diagnosis present

## 2017-02-12 HISTORY — PX: CHOLECYSTECTOMY: SHX55

## 2017-02-12 LAB — CBC WITH DIFFERENTIAL/PLATELET
BASOS ABS: 0 10*3/uL (ref 0.0–0.1)
BASOS PCT: 0 %
EOS ABS: 0 10*3/uL (ref 0.0–0.7)
EOS PCT: 0 %
HCT: 45.1 % (ref 39.0–52.0)
Hemoglobin: 15.4 g/dL (ref 13.0–17.0)
Lymphocytes Relative: 10 %
Lymphs Abs: 1.6 10*3/uL (ref 0.7–4.0)
MCH: 29.2 pg (ref 26.0–34.0)
MCHC: 34.1 g/dL (ref 30.0–36.0)
MCV: 85.6 fL (ref 78.0–100.0)
Monocytes Absolute: 0.7 10*3/uL (ref 0.1–1.0)
Monocytes Relative: 4 %
NEUTROS PCT: 86 %
Neutro Abs: 14.5 10*3/uL — ABNORMAL HIGH (ref 1.7–7.7)
PLATELETS: 214 10*3/uL (ref 150–400)
RBC: 5.27 MIL/uL (ref 4.22–5.81)
RDW: 12.7 % (ref 11.5–15.5)
WBC: 16.9 10*3/uL — AB (ref 4.0–10.5)

## 2017-02-12 LAB — URINALYSIS, ROUTINE W REFLEX MICROSCOPIC
BILIRUBIN URINE: NEGATIVE
GLUCOSE, UA: NEGATIVE mg/dL
Hgb urine dipstick: NEGATIVE
Ketones, ur: NEGATIVE mg/dL
Leukocytes, UA: NEGATIVE
NITRITE: NEGATIVE
PH: 6 (ref 5.0–8.0)
Protein, ur: NEGATIVE mg/dL
SPECIFIC GRAVITY, URINE: 1.026 (ref 1.005–1.030)

## 2017-02-12 LAB — COMPREHENSIVE METABOLIC PANEL
ALBUMIN: 4.2 g/dL (ref 3.5–5.0)
ALT: 44 U/L (ref 17–63)
AST: 28 U/L (ref 15–41)
Alkaline Phosphatase: 98 U/L (ref 38–126)
Anion gap: 7 (ref 5–15)
BUN: 18 mg/dL (ref 6–20)
CHLORIDE: 106 mmol/L (ref 101–111)
CO2: 29 mmol/L (ref 22–32)
CREATININE: 0.93 mg/dL (ref 0.61–1.24)
Calcium: 9.6 mg/dL (ref 8.9–10.3)
GFR calc non Af Amer: >60 mL/min (ref >60)
GLUCOSE: 155 mg/dL — AB (ref 65–99)
Potassium: 3.2 mmol/L — ABNORMAL LOW (ref 3.5–5.1)
SODIUM: 142 mmol/L (ref 135–145)
Total Bilirubin: 0.3 mg/dL (ref 0.3–1.2)
Total Protein: 8 g/dL (ref 6.5–8.1)

## 2017-02-12 LAB — SURGICAL PCR SCREEN
MRSA, PCR: NEGATIVE
STAPHYLOCOCCUS AUREUS: NEGATIVE

## 2017-02-12 LAB — LIPASE, BLOOD: Lipase: 21 U/L (ref 11–51)

## 2017-02-12 SURGERY — LAPAROSCOPIC CHOLECYSTECTOMY
Anesthesia: General | Site: Abdomen

## 2017-02-12 MED ORDER — BUPIVACAINE HCL (PF) 0.5 % IJ SOLN
INTRAMUSCULAR | Status: AC
  Filled 2017-02-12: qty 30

## 2017-02-12 MED ORDER — DEXAMETHASONE SODIUM PHOSPHATE 4 MG/ML IJ SOLN
4.0000 mg | Freq: Once | INTRAMUSCULAR | Status: AC
  Administered 2017-02-12: 4 mg via INTRAVENOUS

## 2017-02-12 MED ORDER — KETOROLAC TROMETHAMINE 30 MG/ML IJ SOLN: 30.0000 mg | Freq: Once | INTRAMUSCULAR | Status: DC

## 2017-02-12 MED ORDER — FENTANYL CITRATE (PF) 100 MCG/2ML IJ SOLN
INTRAMUSCULAR | Status: DC | PRN
  Administered 2017-02-12 (×2): 50 ug via INTRAVENOUS
  Administered 2017-02-12: 100 ug via INTRAVENOUS
  Administered 2017-02-12: 50 ug via INTRAVENOUS

## 2017-02-12 MED ORDER — INFLUENZA VAC SPLIT QUAD 0.5 ML IM SUSY
0.5000 mL | PREFILLED_SYRINGE | INTRAMUSCULAR | Status: AC
  Administered 2017-02-12: 0.5 mL via INTRAMUSCULAR
  Filled 2017-02-12: qty 0.5

## 2017-02-12 MED ORDER — SODIUM CHLORIDE 0.9 % IV SOLN
INTRAVENOUS | Status: DC
  Administered 2017-02-12: 04:00:00 via INTRAVENOUS

## 2017-02-12 MED ORDER — NEOSTIGMINE METHYLSULFATE 10 MG/10ML IV SOLN
INTRAVENOUS | Status: AC
Start: 2017-02-12 — End: ?
  Filled 2017-02-12: qty 1

## 2017-02-12 MED ORDER — DOCUSATE SODIUM 100 MG PO CAPS
100.0000 mg | ORAL_CAPSULE | Freq: Two times a day (BID) | ORAL | Status: DC
  Administered 2017-02-12: 100 mg via ORAL
  Filled 2017-02-12: qty 1

## 2017-02-12 MED ORDER — GLYCOPYRROLATE 0.2 MG/ML IJ SOLN
INTRAMUSCULAR | Status: AC
  Filled 2017-02-12: qty 3

## 2017-02-12 MED ORDER — MIDAZOLAM HCL 2 MG/2ML IJ SOLN
INTRAMUSCULAR | Status: AC
  Filled 2017-02-12: qty 2

## 2017-02-12 MED ORDER — OXYCODONE HCL 5 MG PO TABS: 5.0000 mg | ORAL_TABLET | ORAL | 0 refills | Status: AC | PRN

## 2017-02-12 MED ORDER — DOCUSATE SODIUM 100 MG PO CAPS: 100.0000 mg | ORAL_CAPSULE | Freq: Two times a day (BID) | ORAL | 0 refills | Status: AC

## 2017-02-12 MED ORDER — FENTANYL CITRATE (PF) 250 MCG/5ML IJ SOLN
INTRAMUSCULAR | Status: AC
  Filled 2017-02-12: qty 5

## 2017-02-12 MED ORDER — ACETAMINOPHEN 500 MG PO TABS
1000.0000 mg | ORAL_TABLET | Freq: Four times a day (QID) | ORAL | Status: DC
  Administered 2017-02-12: 1000 mg via ORAL
  Filled 2017-02-12: qty 2

## 2017-02-12 MED ORDER — BUPIVACAINE HCL (PF) 0.5 % IJ SOLN
INTRAMUSCULAR | Status: DC | PRN
  Administered 2017-02-12: 10 mL

## 2017-02-12 MED ORDER — GLYCOPYRROLATE 0.2 MG/ML IJ SOLN
INTRAMUSCULAR | Status: DC | PRN
  Administered 2017-02-12: 0.6 mg via INTRAVENOUS

## 2017-02-12 MED ORDER — OXYCODONE HCL 5 MG PO TABS
5.0000 mg | ORAL_TABLET | ORAL | Status: DC | PRN
  Administered 2017-02-12: 5 mg via ORAL
  Filled 2017-02-12: qty 1

## 2017-02-12 MED ORDER — ONDANSETRON HCL 4 MG/2ML IJ SOLN
4.0000 mg | Freq: Once | INTRAMUSCULAR | Status: AC
  Administered 2017-02-12: 4 mg via INTRAVENOUS
  Filled 2017-02-12: qty 2

## 2017-02-12 MED ORDER — CHLORHEXIDINE GLUCONATE CLOTH 2 % EX PADS: 6.0000 | MEDICATED_PAD | Freq: Once | CUTANEOUS | Status: DC

## 2017-02-12 MED ORDER — LACTATED RINGERS IV SOLN
INTRAVENOUS | Status: DC
  Administered 2017-02-12: 14:00:00 via INTRAVENOUS
  Administered 2017-02-12: 1000 mL via INTRAVENOUS

## 2017-02-12 MED ORDER — DEXAMETHASONE SODIUM PHOSPHATE 4 MG/ML IJ SOLN
INTRAMUSCULAR | Status: AC
  Filled 2017-02-12: qty 1

## 2017-02-12 MED ORDER — SODIUM CHLORIDE 0.9 % IR SOLN
Status: DC | PRN
  Administered 2017-02-12: 1000 mL

## 2017-02-12 MED ORDER — ROCURONIUM BROMIDE 100 MG/10ML IV SOLN
INTRAVENOUS | Status: DC | PRN
  Administered 2017-02-12: 10 mg via INTRAVENOUS
  Administered 2017-02-12: 35 mg via INTRAVENOUS
  Administered 2017-02-12: 5 mg via INTRAVENOUS

## 2017-02-12 MED ORDER — LIDOCAINE HCL (CARDIAC) 10 MG/ML IV SOLN
INTRAVENOUS | Status: DC | PRN
  Administered 2017-02-12: 50 mg via INTRAVENOUS

## 2017-02-12 MED ORDER — NEOSTIGMINE METHYLSULFATE 10 MG/10ML IV SOLN
INTRAVENOUS | Status: DC | PRN
  Administered 2017-02-12: 3 mg via INTRAVENOUS

## 2017-02-12 MED ORDER — FENTANYL CITRATE (PF) 100 MCG/2ML IJ SOLN
25.0000 ug | INTRAMUSCULAR | Status: DC | PRN
  Administered 2017-02-12: 50 ug via INTRAVENOUS
  Filled 2017-02-12: qty 2

## 2017-02-12 MED ORDER — HYDROMORPHONE HCL 1 MG/ML IJ SOLN
1.0000 mg | INTRAMUSCULAR | Status: DC | PRN
  Administered 2017-02-12 (×2): 1 mg via INTRAVENOUS
  Filled 2017-02-12 (×2): qty 1

## 2017-02-12 MED ORDER — DEXTROSE 5 % IV SOLN
1.0000 g | Freq: Once | INTRAVENOUS | Status: AC
  Administered 2017-02-12: 1 g via INTRAVENOUS
  Filled 2017-02-12: qty 10

## 2017-02-12 MED ORDER — HYDROMORPHONE HCL 1 MG/ML IJ SOLN
0.5000 mg | Freq: Once | INTRAMUSCULAR | Status: AC
  Administered 2017-02-12: 0.5 mg via INTRAVENOUS
  Filled 2017-02-12: qty 1

## 2017-02-12 MED ORDER — ONDANSETRON HCL 4 MG/2ML IJ SOLN
4.0000 mg | Freq: Once | INTRAMUSCULAR | Status: AC
  Administered 2017-02-12: 4 mg via INTRAVENOUS

## 2017-02-12 MED ORDER — CEFOTETAN DISODIUM-DEXTROSE 2-2.08 GM-%(50ML) IV SOLR
2.0000 g | INTRAVENOUS | Status: AC
  Administered 2017-02-12: 2 g via INTRAVENOUS

## 2017-02-12 MED ORDER — HEMOSTATIC AGENTS (NO CHARGE) OPTIME
TOPICAL | Status: DC | PRN
  Administered 2017-02-12: 1 via TOPICAL

## 2017-02-12 MED ORDER — CHLORHEXIDINE GLUCONATE CLOTH 2 % EX PADS
6.0000 | MEDICATED_PAD | Freq: Once | CUTANEOUS | Status: DC
  Administered 2017-02-12: 6 via TOPICAL

## 2017-02-12 MED ORDER — CEFOTETAN DISODIUM-DEXTROSE 2-2.08 GM-%(50ML) IV SOLR
INTRAVENOUS | Status: AC
  Filled 2017-02-12: qty 50

## 2017-02-12 MED ORDER — IOPAMIDOL (ISOVUE-300) INJECTION 61%
100.0000 mL | Freq: Once | INTRAVENOUS | Status: AC | PRN
  Administered 2017-02-12: 100 mL via INTRAVENOUS

## 2017-02-12 MED ORDER — ONDANSETRON HCL 4 MG/2ML IJ SOLN
INTRAMUSCULAR | Status: AC
  Filled 2017-02-12: qty 2

## 2017-02-12 MED ORDER — SODIUM CHLORIDE 0.9 % IR SOLN
Status: DC | PRN
  Administered 2017-02-12: 3000 mL

## 2017-02-12 MED ORDER — MIDAZOLAM HCL 2 MG/2ML IJ SOLN
1.0000 mg | INTRAMUSCULAR | Status: AC
  Administered 2017-02-12: 2 mg via INTRAVENOUS

## 2017-02-12 MED ORDER — HYDROMORPHONE HCL 1 MG/ML IJ SOLN
1.0000 mg | Freq: Once | INTRAMUSCULAR | Status: AC
  Administered 2017-02-12: 1 mg via INTRAVENOUS
  Filled 2017-02-12: qty 1

## 2017-02-12 MED ORDER — PROPOFOL 10 MG/ML IV BOLUS
INTRAVENOUS | Status: DC | PRN
  Administered 2017-02-12: 150 mg via INTRAVENOUS
  Administered 2017-02-12: 50 mg via INTRAVENOUS

## 2017-02-12 SURGICAL SUPPLY — 47 items
APPLIER CLIP ROT 10 11.4 M/L (STAPLE) ×3
BAG HAMPER (MISCELLANEOUS) ×3 IMPLANT
BAG RETRIEVAL 10 (BASKET) ×1
BAG RETRIEVAL 10MM (BASKET) ×1
BLADE SURG 15 STRL LF DISP TIS (BLADE) ×1 IMPLANT
BLADE SURG 15 STRL SS (BLADE) ×2
CHLORAPREP W/TINT 26ML (MISCELLANEOUS) ×3 IMPLANT
CLIP APPLIE ROT 10 11.4 M/L (STAPLE) ×1 IMPLANT
CLOTH BEACON ORANGE TIMEOUT ST (SAFETY) ×3 IMPLANT
COVER LIGHT HANDLE STERIS (MISCELLANEOUS) ×6 IMPLANT
DECANTER SPIKE VIAL GLASS SM (MISCELLANEOUS) ×3 IMPLANT
DERMABOND ADVANCED (GAUZE/BANDAGES/DRESSINGS) ×2
DERMABOND ADVANCED .7 DNX12 (GAUZE/BANDAGES/DRESSINGS) ×1 IMPLANT
ELECT REM PT RETURN 9FT ADLT (ELECTROSURGICAL) ×3
ELECTRODE REM PT RTRN 9FT ADLT (ELECTROSURGICAL) ×1 IMPLANT
FILTER SMOKE EVAC LAPAROSHD (FILTER) ×3 IMPLANT
FORMALIN 10 PREFIL 120ML (MISCELLANEOUS) ×3 IMPLANT
GLOVE BIO SURGEON STRL SZ 6.5 (GLOVE) ×2 IMPLANT
GLOVE BIO SURGEONS STRL SZ 6.5 (GLOVE) ×1
GLOVE BIOGEL PI IND STRL 6.5 (GLOVE) ×2 IMPLANT
GLOVE BIOGEL PI IND STRL 7.0 (GLOVE) ×1 IMPLANT
GLOVE BIOGEL PI INDICATOR 6.5 (GLOVE) ×4
GLOVE BIOGEL PI INDICATOR 7.0 (GLOVE) ×2
GOWN STRL REUS W/TWL LRG LVL3 (GOWN DISPOSABLE) ×9 IMPLANT
HEMOSTAT SNOW SURGICEL 2X4 (HEMOSTASIS) ×3 IMPLANT
INST SET LAPROSCOPIC AP (KITS) ×3 IMPLANT
IV NS IRRIG 3000ML ARTHROMATIC (IV SOLUTION) ×3 IMPLANT
KIT ROOM TURNOVER APOR (KITS) ×3 IMPLANT
MANIFOLD NEPTUNE II (INSTRUMENTS) ×3 IMPLANT
NEEDLE INSUFFLATION 14GA 120MM (NEEDLE) ×3 IMPLANT
NS IRRIG 1000ML POUR BTL (IV SOLUTION) ×3 IMPLANT
PACK LAP CHOLE LZT030E (CUSTOM PROCEDURE TRAY) ×3 IMPLANT
PAD ARMBOARD 7.5X6 YLW CONV (MISCELLANEOUS) ×3 IMPLANT
SET BASIN LINEN APH (SET/KITS/TRAYS/PACK) ×3 IMPLANT
SET TUBE IRRIG SUCTION NO TIP (IRRIGATION / IRRIGATOR) ×3 IMPLANT
SLEEVE ENDOPATH XCEL 5M (ENDOMECHANICALS) ×3 IMPLANT
SUT MNCRL AB 4-0 PS2 18 (SUTURE) ×3 IMPLANT
SUT VICRYL 0 UR6 27IN ABS (SUTURE) ×3 IMPLANT
SYS BAG RETRIEVAL 10MM (BASKET) ×1
SYSTEM BAG RETRIEVAL 10MM (BASKET) ×1 IMPLANT
TROCAR XCEL NON-BLD 11X100MML (ENDOMECHANICALS) ×3 IMPLANT
TROCAR XCEL NON-BLD 5MMX100MML (ENDOMECHANICALS) ×3 IMPLANT
TROCAR Z-THREAD SLEEVE 11X100 (TROCAR) ×3 IMPLANT
TUBE CONNECTING 12'X1/4 (SUCTIONS) ×1
TUBE CONNECTING 12X1/4 (SUCTIONS) ×2 IMPLANT
TUBING INSUFFLATION (TUBING) ×3 IMPLANT
WARMER LAPAROSCOPE (MISCELLANEOUS) ×3 IMPLANT

## 2017-02-12 NOTE — Transfer of Care (Signed)
Immediate Anesthesia Transfer of Care Note  Patient: Ronnie Daniel  Procedure(s) Performed: LAPAROSCOPIC CHOLECYSTECTOMY (N/A Abdomen)  Patient Location: PACU  Anesthesia Type:General  Level of Consciousness: awake and patient cooperative  Airway & Oxygen Therapy: Patient Spontanous Breathing  Post-op Assessment: Report given to RN and Post -op Vital signs reviewed and stable  Post vital signs: Reviewed and stable  Last Vitals:  Vitals:   02/12/17 1300 02/12/17 1315  BP: 115/71 113/79  Pulse:    Resp: 17 15  Temp:    SpO2: 97% 99%    Last Pain:  Vitals:   02/12/17 1154  TempSrc: Oral  PainSc: 3       Patients Stated Pain Goal: 3 (02/12/17 1154)  Complications: No apparent anesthesia complications

## 2017-02-12 NOTE — Anesthesia Postprocedure Evaluation (Signed)
Anesthesia Post Note  Patient: Ronnie Daniel  Procedure(s) Performed: LAPAROSCOPIC CHOLECYSTECTOMY (N/A Abdomen)  Patient location during evaluation: PACU Anesthesia Type: General Level of consciousness: awake and alert Pain management: satisfactory to patient Vital Signs Assessment: post-procedure vital signs reviewed and stable Respiratory status: spontaneous breathing Cardiovascular status: stable Postop Assessment: no apparent nausea or vomiting Anesthetic complications: no     Last Vitals:  Vitals:   02/12/17 1445 02/12/17 1500  BP: 111/80 132/87  Pulse: 92 89  Resp: 17 13  Temp: 37 C   SpO2: 97%     Last Pain:  Vitals:   02/12/17 1445  TempSrc:   PainSc: 3                  Marieliz Strang

## 2017-02-12 NOTE — Progress Notes (Signed)
Informed consent obtained via interpreter Lars MageJuan [phone]#[id number].  Dr Henreitta LeberBridges explained the procedure to Mr Ronnie Daniel with help of interpreter and the patient agreed and signed.  It was also explained to pt the need for a nasal swab prior to surgery. Pt rates pain at 7 on the 0/10 scale at this time.

## 2017-02-12 NOTE — Op Note (Signed)
Operative Note   Preoperative Diagnosis: Acute cholecystitis    Postoperative Diagnosis: Acute on chronic cholecystitis with hydrops    Procedure(s) Performed: Laparoscopic cholecystectomy   Surgeon: Leatrice JewelsLindsay C. Henreitta LeberBridges, MD   Assistants: No qualified resident was available   Anesthesia: General endotracheal   Anesthesiologist: Laurene FootmanGonzalez, Luis, MD    Specimens: Gallbladder    Estimated Blood Loss: Minimal    Blood Replacement: None    Complications: None   Wound Class: Contaminated    Operative Findings: Distended, inflamed gallbladder, requiring decompression    Procedure: The patient was taken to the operating room and placed supine. General endotracheal anesthesia was induced. Intravenous antibiotics were administered per protocol. An orogastric tube positioned to decompress the stomach. The abdomen was prepared and draped in the usual sterile fashion.    An incision was made supraumbilical through the patient's prior scar, and a towel clip was used to elevated the abdomen.  A Veress needle was inserted through this incision, and the Veress technique was utilized to achieve pneumoperitoneum to 15 mmHg with carbon dioxide. A 11 mm optiview port was placed through the supraumbilical region, and a 10 mm 0-degree operative laparoscope was introduced. The area underlying the trocar and Veress needle were inspected and without evidence of injury.  Remaining trocars were placed under direct vision. Two 5 mm ports were placed in the right abdomen, between the anterior axillary and midclavicular line.  A final 11 mm port was placed through the mid-epigastrium, near the falciform ligament.  The gallbladder was inflamed and distended, and the fundus could not be grasped. Given this the gallbladder was decompressed and a suction device was used to evacuate the bile which was clear, indicating hydrops.  After decompression, the gallbladder fundus was elevated cephalad and the infundibulum was  retracted to the patient's right. The gallbladder/cystic duct junction was skeletonized. The cystic artery noted in the triangle of Calot and was also skeletonized.  We then continued liberal medial and lateral dissection until the critical view of safety was achieved.    The cystic duct and cystic artery were doubly clipped and divided using locking clips.  A posterior branch of the cystic artery was clipped and divided.  The gallbladder was then dissected from the liver bed with electrocautery.  There was some minimal spillage of small stones and bile.   This was suctioned out and irrigated.  The hepatic bed was hemostatic and Surgical Jamelle HaringSnow was placed.  The specimen was placed in an Endopouch and was retrieved through the epigastric site.   Final inspection revealed acceptable hemostasis. A 0 Vicryl fascial sutures at the umbilical and epigastric port site. Trocars were removed and pneumoperitoneum was released. Skin incisions were closed with 4-0 Monocryl subcuticular sutures and Dermabond. The patient was awakened from anesthesia and extubated without complication.   All counts were correct at the end of the case.    Algis GreenhouseLindsay Bridges, MD [facility] [address] [place], [place] [id number] [phone] (office)

## 2017-02-12 NOTE — Progress Notes (Signed)
Discharge instructions read to patient and family via interpreter ButnerJuan # [phone].  All questions answered to their satisfaction. Discharged to home with family

## 2017-02-12 NOTE — Progress Notes (Signed)
Interpreter [phone] Dois DavenportSandra used to explain and educate pt on flu vaccine.  Pt verbalized understanding and vaccine was given in the left deltoid.

## 2017-02-12 NOTE — Anesthesia Procedure Notes (Signed)
Procedure Name: Intubation Date/Time: 02/12/2017 1:31 PM Performed by: Vista Deck, CRNA Pre-anesthesia Checklist: Patient identified, Patient being monitored, Timeout performed, Emergency Drugs available and Suction available Patient Re-evaluated:Patient Re-evaluated prior to induction Oxygen Delivery Method: Circle System Utilized Preoxygenation: Pre-oxygenation with 100% oxygen Induction Type: IV induction, Rapid sequence and Cricoid Pressure applied Ventilation: Mask ventilation without difficulty Laryngoscope Size: Mac and 3 Grade View: Grade II Tube type: Oral Tube size: 7.0 mm Number of attempts: 1 Airway Equipment and Method: stylet and Oral airway Placement Confirmation: ETT inserted through vocal cords under direct vision,  positive ETCO2 and breath sounds checked- equal and bilateral Secured at: 22 cm Tube secured with: Tape Dental Injury: Teeth and Oropharynx as per pre-operative assessment

## 2017-02-12 NOTE — Anesthesia Preprocedure Evaluation (Signed)
Anesthesia Evaluation  Patient identified by MRN, date of birth, ID band Patient awake    Reviewed: Allergy & Precautions, NPO status , Patient's Chart, lab work & pertinent test results  Airway Mallampati: I  TM Distance: >3 FB Neck ROM: Full    Dental  (+) Teeth Intact   Pulmonary neg pulmonary ROS,    breath sounds clear to auscultation       Cardiovascular negative cardio ROS   Rhythm:Regular Rate:Normal     Neuro/Psych negative neurological ROS  negative psych ROS   GI/Hepatic negative GI ROS, Neg liver ROS,   Endo/Other    Renal/GU Renal disease (hx stones)     Musculoskeletal   Abdominal   Peds  Hematology negative hematology ROS (+)   Anesthesia Other Findings Acute cholecystitis  Reproductive/Obstetrics                             Anesthesia Physical Anesthesia Plan  ASA: I and emergent  Anesthesia Plan: General   Post-op Pain Management:    Induction: Intravenous, Rapid sequence and Cricoid pressure planned  PONV Risk Score and Plan:   Airway Management Planned: Oral ETT  Additional Equipment:   Intra-op Plan:   Post-operative Plan: Extubation in OR  Informed Consent: I have reviewed the patients History and Physical, chart, labs and discussed the procedure including the risks, benefits and alternatives for the proposed anesthesia with the patient or authorized representative who has indicated his/her understanding and acceptance.     Plan Discussed with:   Anesthesia Plan Comments:         Anesthesia Quick Evaluation

## 2017-02-12 NOTE — H&P (Signed)
[facility] History and Physical  Reason for Referral: Cholelithiasis and uncontrolled pain Referring Physician:  Dr. York Cerise  Chief Complaint    Abdominal Pain      Ronnie Daniel is a 36 y.o. male.  HPI: Mr. Ronnie Daniel is a 36 yo otherwise healthy, Spanish speaking gentleman who presented to the Ed with RUQ pain and underwent a CT scan that demonstrated stones and intrahepatic biliary dilation, with normal LFTs and a leukocytosis.  He was unable to get his pain under control, and the ED called for further management.  Given his symptoms and biliary dilation on CT, I was concerned for choledocholithiasis and possible cholecystitis with the leukocytosis.  I brought him in under observation for further workup with an [place].  I spoke to the patient this AM and he reported a history of cholelithiasis with intermittent episodes of pain that occur with food. He also reported some nausea/vomiting. He reports with this episode he had some chills. Usually the pain improves, but this time the pain was not improving even with IV pain medications.     Past Medical History:  Diagnosis Date  . Kidney stones     Past Surgical History:  Procedure Laterality Date  . APPENDECTOMY      Family History  Problem Relation Age of Onset  . Cirrhosis Brother        etoh  . Diabetes Brother   . Colon cancer Neg Hx     Social History   Tobacco Use  . Smoking status: Never Smoker  . Smokeless tobacco: Never Used  Substance Use Topics  . Alcohol use: No    Comment: Former drinker-stopped 3 months ago  . Drug use: No    Medications:  I have reviewed the patient's current medications. Prior to Admission:  No medications prior to admission.   Scheduled: . [START ON 02/13/2017] Influenza vac split quadrivalent PF  0.5 mL Intramuscular Tomorrow-1000   Continuous: . sodium chloride 100 mL/hr at 02/12/17 0422   Allergies: No Known Allergies  ROS:  A comprehensive review of systems  was negative except for: Constitutional: positive for chills Gastrointestinal: positive for abdominal pain and nausea  Blood pressure (!) 150/81, pulse 65, temperature 98.3 F (36.8 C), temperature source Oral, resp. rate 16, height _0  (1.676 m), weight 138 lb 3.7 oz (62.7 kg), SpO2 99 %. Physical Exam  Constitutional: He is oriented to person, place, and time and well-developed, well-nourished, and in no distress.  HENT:  Head: Normocephalic.  Eyes: Pupils are equal, round, and reactive to light.  Cardiovascular: Normal rate and regular rhythm.  Pulmonary/Chest: Effort normal and breath sounds normal.  Abdominal: Soft. He exhibits no distension. There is tenderness. There is no rebound and no guarding.  Musculoskeletal: Normal range of motion. He exhibits no edema.  Neurological: He is alert and oriented to person, place, and time.  Skin: Skin is warm and dry.  Psychiatric: Mood, memory, affect and judgment normal.  Vitals reviewed.   Results: Results for orders placed or performed during the hospital encounter of 02/11/17 (from the past 48 hour(s))  CBC with Differential     Status: Abnormal   Collection Time: 02/12/17 12:16 AM  Result Value Ref Range   WBC 16.9 (H) 4.0 - 10.5 K/uL   RBC 5.27 4.22 - 5.81 MIL/uL   Hemoglobin 15.4 13.0 - 17.0 g/dL   HCT 45.1 39.0 - 52.0 %   MCV 85.6 78.0 - 100.0 fL   MCH 29.2 26.0 - 34.0  pg   MCHC 34.1 30.0 - 36.0 g/dL   RDW 12.7 11.5 - 15.5 %   Platelets 214 150 - 400 K/uL   Neutrophils Relative % 86 %   Neutro Abs 14.5 (H) 1.7 - 7.7 K/uL   Lymphocytes Relative 10 %   Lymphs Abs 1.6 0.7 - 4.0 K/uL   Monocytes Relative 4 %   Monocytes Absolute 0.7 0.1 - 1.0 K/uL   Eosinophils Relative 0 %   Eosinophils Absolute 0.0 0.0 - 0.7 K/uL   Basophils Relative 0 %   Basophils Absolute 0.0 0.0 - 0.1 K/uL  Comprehensive metabolic panel     Status: Abnormal   Collection Time: 02/12/17 12:16 AM  Result Value Ref Range   Sodium 142 135 - 145 mmol/L    Potassium 3.2 (L) 3.5 - 5.1 mmol/L   Chloride 106 101 - 111 mmol/L   CO2 29 22 - 32 mmol/L   Glucose, Bld 155 (H) 65 - 99 mg/dL   BUN 18 6 - 20 mg/dL   Creatinine, Ser 0.93 0.61 - 1.24 mg/dL   Calcium 9.6 8.9 - 10.3 mg/dL   Total Protein 8.0 6.5 - 8.1 g/dL   Albumin 4.2 3.5 - 5.0 g/dL   AST 28 15 - 41 U/L   ALT 44 17 - 63 U/L   Alkaline Phosphatase 98 38 - 126 U/L   Total Bilirubin 0.3 0.3 - 1.2 mg/dL   GFR calc non Af Amer >60 >60 mL/min   GFR calc Af Amer >60 >60 mL/min    Comment: (NOTE) The eGFR has been calculated using the CKD EPI equation. This calculation has not been validated in all clinical situations. eGFR's persistently <60 mL/min signify possible Chronic Kidney Disease.    Anion gap 7 5 - 15  Lipase, blood     Status: None   Collection Time: 02/12/17 12:16 AM  Result Value Ref Range   Lipase 21 11 - 51 U/L  Urinalysis, Routine w reflex microscopic     Status: Abnormal   Collection Time: 02/12/17 12:57 AM  Result Value Ref Range   Color, Urine YELLOW YELLOW   APPearance CLOUDY (A) CLEAR   Specific Gravity, Urine 1.026 1.005 - 1.030   pH 6.0 5.0 - 8.0   Glucose, UA NEGATIVE NEGATIVE mg/dL   Hgb urine dipstick NEGATIVE NEGATIVE   Bilirubin Urine NEGATIVE NEGATIVE   Ketones, ur NEGATIVE NEGATIVE mg/dL   Protein, ur NEGATIVE NEGATIVE mg/dL   Nitrite NEGATIVE NEGATIVE   Leukocytes, UA NEGATIVE NEGATIVE    Personally reviewed CT- large gallbladder with stones and enlarged biliary duct, [place] with CBD to 66m but no stones, thickened gallbladder wall   Ct Abdomen Pelvis W Contrast  Result Date: 02/12/2017 CLINICAL DATA:  36y/o M; worsening right upper quadrant abdominal pain. EXAM: CT ABDOMEN AND PELVIS WITH CONTRAST TECHNIQUE: Multidetector CT imaging of the abdomen and pelvis was performed using the standard protocol following bolus administration of intravenous contrast. CONTRAST:  1060mISOVUE-300 IOPAMIDOL (ISOVUE-300) INJECTION 61% COMPARISON:  04/17/2016 CT  of the abdomen and pelvis and right upper quadrant ultrasound. FINDINGS: Lower chest: No acute abnormality. Hepatobiliary: No focal liver lesion. Interval development of mild intrahepatic biliary ductal dilatation. Common bile duct measures 7 mm which is also increased from prior CT and ultrasound. No obstructing stone or mass is identified. No appreciable gallbladder wall thickening or pericholecystic fluid. Interval increase in gallbladder stones including within the neck of gallbladder. Pancreas: Unremarkable. No pancreatic ductal dilatation or surrounding inflammatory  changes. Spleen: Normal in size without focal abnormality. Adrenals/Urinary Tract: Adrenal glands are unremarkable. Kidneys are normal, without renal calculi, focal lesion, or hydronephrosis. Bladder is unremarkable. Stomach/Bowel: Stomach is within normal limits. Appendix appears normal. No evidence of bowel wall thickening, distention, or inflammatory changes. Vascular/Lymphatic: No significant vascular findings are present. No enlarged abdominal or pelvic lymph nodes. Reproductive: Prostate is unremarkable. Other: No abdominal wall hernia or abnormality. No abdominopelvic ascites. Musculoskeletal: No acute or significant osseous findings. IMPRESSION: 1. Interval increase in cholelithiasis. No secondary signs of acute cholecystitis. 2. Interval mild intrahepatic biliary ductal dilatation and size of common bile duct measuring 7 mm. No obstructing stone or mass identified. If clinically indicated consider MRI/MRCP of the abdomen to further characterize. Electronically Signed   By: Kristine Garbe M.D.   On: 02/12/2017 01:33   US Abdomen Limited Ruq  Result Date: 02/12/2017 CLINICAL DATA:  Right upper quadrant abdominal pain for 1 day, worsening after eating. Known gallstones. Previous appendectomy. EXAM: ULTRASOUND ABDOMEN LIMITED RIGHT UPPER QUADRANT COMPARISON:  CT 02/11/2017.  Ultrasound 04/17/2016. FINDINGS: Gallbladder:  Cholelithiasis again noted with multiple stones in the gallbladder neck. There is new mild gallbladder wall thickening to 4 mm. There is trace pericholecystic fluid, and according to the sonographer, the sonographic Murphy sign is positive. Common bile duct: Diameter: 7 mm.  No evidence of intraductal calculus. Liver: No focal lesion identified. Within normal limits in parenchymal echogenicity. Portal vein is patent on color Doppler imaging with normal direction of blood flow towards the liver. IMPRESSION: 1. Cholelithiasis with new mild gallbladder wall thickening and a positive sonographic Murphy's sign suspicious for early acute cholecystitis. 2. New mild biliary dilatation as seen on earlier CT. Liver function studies done today are within normal limits. Electronically Signed   By: Richardean Sale M.D.   On: 02/12/2017 09:16     Assessment & Plan:  Ronnie Daniel is a 36 y.o. male with cholecystitis on [place] and no choledocholithiasis with a normal T bili on his labs. He is continuing to have RUQ abdominal pain.  Given this, I will take him for laparoscopic cholecystectomy and do not believe an IOC is indicated at this time. He likely passed a stone at some point in the past or during this episode.  -OR for lap chole -Consent obtained with interpretor   PLAN: I counseled the patient about the indication, risks and benefits of laparoscopic cholecystectomy.  He understands there is a very small chance for bleeding, infection, injury to normal structures (including common bile duct), conversion to open surgery, persistent symptoms, evolution of postcholecystectomy diarrhea, need for secondary interventions, anesthesia reaction, cardiopulmonary issues and other risks not specifically detailed here. I described the expected recovery, the plan for follow-up and the restrictions during the recovery phase.  All questions were answered.   All questions were answered to the satisfaction of the  patient.   Virl Cagey 02/12/2017, 10:03 AM

## 2017-02-12 NOTE — ED Provider Notes (Signed)
[facility] EMERGENCY DEPARTMENT Provider Note   CSN: [id number] Arrival date & time: 02/11/17  2341     History   Chief Complaint Chief Complaint  Patient presents with  . Abdominal Pain    HPI Ronnie Daniel is a 36 y.o. male.  The history is provided by the patient.  Abdominal Pain   This is a new problem. The current episode started 1 to 2 hours ago. The problem occurs constantly. The problem has been gradually worsening. The pain is associated with eating. The pain is located in the RUQ. The quality of the pain is sharp. The pain is at a severity of 10/10. The pain is severe. Associated symptoms include vomiting. Pertinent negatives include fever, diarrhea, hematochezia, melena, constipation, dysuria, frequency and hematuria. The symptoms are aggravated by palpation. Nothing relieves the symptoms.    36 year old male who presents with right upper quadrant abdominal pain and flank pain.  He has a history of appendectomy, kidney stones, and reported gallbladder problems.  Reports onset of right upper quadrant abdominal pain that radiates from the right flank several hours ago.  No other aggravating or alleviating factors.  Associated with nausea and vomiting.  Denies fever, diarrhea, constipation, hematuria, dysuria, urinary frequency. Past Medical History:  Diagnosis Date  . Kidney stones     Patient Active Problem List   Diagnosis Date Noted  . Abnormal LFTs 04/19/2016  . RUQ pain 04/19/2016    Past Surgical History:  Procedure Laterality Date  . APPENDECTOMY         Home Medications    Prior to Admission medications   Not on File    Family History Family History  Problem Relation Age of Onset  . Cirrhosis Brother        etoh  . Diabetes Brother   . Colon cancer Neg Hx     Social History Social History   Tobacco Use  . Smoking status: Never Smoker  . Smokeless tobacco: Never Used  Substance Use Topics  . Alcohol use: No    Comment: Former  drinker-stopped 3 months ago  . Drug use: No     Allergies   Patient has no known allergies.   Review of Systems Review of Systems  Constitutional: Negative for fever.  Gastrointestinal: Positive for abdominal pain and vomiting. Negative for constipation, diarrhea, hematochezia and melena.  Genitourinary: Negative for dysuria, frequency and hematuria.  All other systems reviewed and are negative.    Physical Exam Updated Vital Signs BP 135/87 (BP Location: Left Arm)   Pulse 62   Temp 98.3 F (36.8 C) (Oral)   Resp 16   Ht 5' 6"  (1.676 m)   Wt 63.5 kg (140 lb)   SpO2 97%   BMI 22.60 kg/m   Physical Exam Physical Exam  Nursing note and vitals reviewed. Constitutional: Well developed, well nourished, appears uncomfortable due to pain, non-toxic, and in no acute distress Head: Normocephalic and atraumatic.  Mouth/Throat: Oropharynx is clear and moist.  Neck: Normal range of motion. Neck supple.  Cardiovascular: Normal rate and regular rhythm.   Pulmonary/Chest: Effort normal and breath sounds normal.  Abdominal: Soft. There is RUQ tenderness and right CVA tenderness. There is no rebound and no guarding.  Musculoskeletal: Normal range of motion.  Neurological: Alert, no facial droop, fluent speech, moves all extremities symmetrically Skin: Skin is warm and dry.  Psychiatric: Cooperative   ED Treatments / Results  Labs (all labs ordered are listed, but only abnormal results are displayed) Labs  Reviewed  CBC WITH DIFFERENTIAL/PLATELET - Abnormal; Notable for the following components:      Result Value   WBC 16.9 (*)    Neutro Abs 14.5 (*)    All other components within normal limits  COMPREHENSIVE METABOLIC PANEL - Abnormal; Notable for the following components:   Potassium 3.2 (*)    Glucose, Bld 155 (*)    All other components within normal limits  URINALYSIS, ROUTINE W REFLEX MICROSCOPIC - Abnormal; Notable for the following components:   APPearance CLOUDY (*)     All other components within normal limits  LIPASE, BLOOD    EKG  EKG Interpretation None       Radiology Ct Abdomen Pelvis W Contrast  Result Date: 02/12/2017 CLINICAL DATA:  36 y/o M; worsening right upper quadrant abdominal pain. EXAM: CT ABDOMEN AND PELVIS WITH CONTRAST TECHNIQUE: Multidetector CT imaging of the abdomen and pelvis was performed using the standard protocol following bolus administration of intravenous contrast. CONTRAST:  185m ISOVUE-300 IOPAMIDOL (ISOVUE-300) INJECTION 61% COMPARISON:  04/17/2016 CT of the abdomen and pelvis and right upper quadrant ultrasound. FINDINGS: Lower chest: No acute abnormality. Hepatobiliary: No focal liver lesion. Interval development of mild intrahepatic biliary ductal dilatation. Common bile duct measures 7 mm which is also increased from prior CT and ultrasound. No obstructing stone or mass is identified. No appreciable gallbladder wall thickening or pericholecystic fluid. Interval increase in gallbladder stones including within the neck of gallbladder. Pancreas: Unremarkable. No pancreatic ductal dilatation or surrounding inflammatory changes. Spleen: Normal in size without focal abnormality. Adrenals/Urinary Tract: Adrenal glands are unremarkable. Kidneys are normal, without renal calculi, focal lesion, or hydronephrosis. Bladder is unremarkable. Stomach/Bowel: Stomach is within normal limits. Appendix appears normal. No evidence of bowel wall thickening, distention, or inflammatory changes. Vascular/Lymphatic: No significant vascular findings are present. No enlarged abdominal or pelvic lymph nodes. Reproductive: Prostate is unremarkable. Other: No abdominal wall hernia or abnormality. No abdominopelvic ascites. Musculoskeletal: No acute or significant osseous findings. IMPRESSION: 1. Interval increase in cholelithiasis. No secondary signs of acute cholecystitis. 2. Interval mild intrahepatic biliary ductal dilatation and size of common bile  duct measuring 7 mm. No obstructing stone or mass identified. If clinically indicated consider MRI/MRCP of the abdomen to further characterize. Electronically Signed   By: LKristine GarbeM.D.   On: 02/12/2017 01:33    Procedures Procedures (including critical care time)  Medications Ordered in ED Medications  HYDROmorphone (DILAUDID) injection 0.5 mg (0.5 mg Intravenous Given 02/12/17 0022)  ondansetron (ZOFRAN) injection 4 mg (4 mg Intravenous Given 02/12/17 0022)  iopamidol (ISOVUE-300) 61 % injection 100 mL (100 mLs Intravenous Contrast Given 02/12/17 0113)  HYDROmorphone (DILAUDID) injection 0.5 mg (0.5 mg Intravenous Given 02/12/17 0104)  HYDROmorphone (DILAUDID) injection 0.5 mg (0.5 mg Intravenous Given 02/12/17 0214)  HYDROmorphone (DILAUDID) injection 1 mg (1 mg Intravenous Given 02/12/17 0328)     Initial Impression / Assessment and Plan / ED Course  I have reviewed the triage vital signs and the nursing notes.  Pertinent labs & imaging results that were available during my care of the patient were reviewed by me and considered in my medical decision making (see chart for details).     36year old male who presents with right upper quadrant abdominal pain after eating pizza tonight.  Does have a previous history of biliary colic.  Records were reviewed and patient has been seen in January 2018 with similar presentation.  Follow-up with general surgery as an outpatient.  He is afebrile, hemodynamically stable  here in the ED.  He does have a leukocytosis of 16.  Normal LFTs and lipase.  CT abdomen and pelvis performed given no access to ultrasound.  He has increased cholelithiasis without evidence of cholecystitis.  There is some intrahepatic biliary ductal dilation with common bile duct of 7 mm, but again normal LFTs, alk phos, bilirubin.  Pain not well controlled in the ED.  He is requiring multiple doses of Dilaudid.  Given persistent biliary colic Dr. Constance Haw from  general surgery is consulted.  She has recommended right upper quadrant ultrasound to evaluate ductal dilation better.  She will admit him to the surgical service for potential cholecystectomy. Dose of ceftraixone given and RUQ [place] provided.  Final Clinical Impressions(s) / ED Diagnoses   Final diagnoses:  Biliary colic    ED Discharge Orders    None       Forde Dandy, MD 02/12/17 [phone]

## 2017-02-12 NOTE — Discharge Instructions (Signed)
Instrucciones de descarga:  Ducha segn su rutina habitual.  Tome tylenol e ibuprofeno segn sea necesario para controlar el dolor, alternando cada 4-6 horas.  Tome Roxicodone para el dolor irruptivo.  Tome colace para el estreimiento relacionado con medicamentos narcticos para Chief Technology Officerel dolor.  No recoja el pegamento Dermabond en sus sitios de incisin.   Colecistectoma laparoscpica, cuidados posteriores (Laparoscopic Cholecystectomy, Care After) Estas indicaciones le proporcionan informacin acerca de cmo deber cuidarse despus del procedimiento. El mdico tambin podr darle instrucciones especficas. Comunquese con el mdico si tiene algn problema o tiene preguntas despus del procedimiento. CUIDADOS EN EL HOGAR Cuidado de la incisin  Siga las indicaciones del mdico en lo que respecta al cuidado de los cortes de la ciruga (incisiones). Haga lo siguiente: ? Lvese las manos con agua y jabn antes de Multimedia programmercambiar las vendas (vendaje). Use un desinfectante para manos si no dispone de [place] y [place]jabn. ? Cambie el vendaje como se lo haya indicado el mdico. ? No retire los puntos (suturas), el QUALCOMMadhesivo para la piel o las tiras Richburgadhesivas. Tal vez deban dejarse puestos en la piel durante 2semanas o ms tiempo. Si las tiras Monarchadhesivas se despegan y se enroscan, puede recortar los bordes sueltos. No retire las tiras Agilent Technologiesadhesivas por completo a menos que el mdico lo autorice.  No tome baos de inmersin, no practique natacin ni use el jacuzzi hasta que el mdico lo autorice. Pregntele al mdico si puede ducharse. Delle Reiningal vez solo le permitan darse baos de Platinaesponja. Instrucciones generales  Baxter Internationalome los medicamentos de venta libre y los recetados solamente como se lo haya indicado el mdico.  No conduzca ni use maquinaria pesada mientras toma analgsicos recetados.  Reanude la dieta normal como se lo haya indicado el mdico.  No levante ningn objeto que pese ms de 10libras (4,5kg).  No practique  deportes de contacto durante 1semana o hasta que el mdico lo autorice. SOLICITE AYUDA SI:  Tiene enrojecimiento, hinchazn o Art therapistdolor en el lugar de los cortes quirrgicos.  Tiene secrecin de lquido, sangre o pus que Micron Technologyemana de los cortes.  Percibe que sale mal olor de la zona de las incisiones.  Los cortes quirrgicos se abren.  Tiene fiebre.  SOLICITE AYUDA DE INMEDIATO SI:  Tiene una erupcin cutnea.  Tiene dificultad para respirar.  Siente dolor en el pecho.  Siente que Chief Technology Officerel dolor en los hombros (en la zona donde van los breteles) Harrington Challengerempeora.  Se desvanece (se desmaya) o se marea mientras est de pie.  Tiene dolor muy intenso de vientre (abdomen).  Tiene malestar estomacal (nuseas) o vomita durante ms de 1da.  Esta informacin no tiene Theme park managercomo fin reemplazar el consejo del mdico. Asegrese de hacerle al mdico cualquier pregunta que tenga. Document Released: 11/23/2010 Document Revised: 12/02/2014 Document Reviewed: 08/30/2015 Elsevier Interactive Patient Education  2017 Elsevier Inc.   Colecistitis (Cholecystitis) La colecistitis es la hinchazn y la irritacin (inflamacin) de la vescula biliar, un rgano que tiene forma de pera y se encuentra debajo del hgado, del lado derecho del cuerpo. A menudo la causa de esta afeccin son los clculos en la vescula biliar. El mdico puede hacerle estudios para saber cmo funciona su vescula biliar. Estos estudios pueden incluir los siguientes:  Pruebas de diagnstico por imgenes, como: ? Hydrologistcografa. ? Resonancia magntica.  Estudios que controlan el funcionamiento del hgado. Esta afeccin requiere tratamiento. CUIDADOS EN EL HOGAR El cuidado en el hogar depender del tipo de Devontratamiento. En general:  Baxter Internationalome los medicamentos de [address] libre y [place]  recetados solamente como se lo haya indicado el mdico.  Si le recetaron un antibitico, tmelo como se lo haya indicado el mdico. No deje de tomar los antibiticos aunque  comience a Actorsentirse mejor.  Siga las indicaciones del mdico respecto de las comidas o las bebidas. Cuando le permitan comer, no coma ni beba nada que desencadene los sntomas.  Concurra a todas las visitas de control como se lo haya indicado el mdico. Esto es importante. SOLICITE AYUDA SI:  Siente dolor y los medicamentos no [address] West[address] Drivehacen efecto.  Tiene fiebre. SOLICITE AYUDA DE INMEDIATO SI:  El dolor se desplaza a los siguientes lugares: ? Otra parte del abdomen. ? La espalda.  Los sntomas no desaparecen.  Aparecen nuevos sntomas. Esta informacin no tiene Theme park managercomo fin reemplazar el consejo del mdico. Asegrese de hacerle al mdico cualquier pregunta que tenga. Document Released: 03/02/2011 Document Revised: 12/02/2014 Document Reviewed: 06/24/2014 Elsevier Interactive Patient Education  2018 [employer]vin[employer]vier Inc.

## 2017-02-13 ENCOUNTER — Encounter (HOSPITAL_COMMUNITY): Payer: Self-pay | Admitting: General Surgery

## 2017-02-13 NOTE — Discharge Summary (Signed)
Physician Discharge Summary  Patient ID: Ronnie Daniel MRN: [id number] DOB/AGE: 34-Jun-1982 34 y.o.  Admit date: 02/11/2017 Discharge date: 02/12/2017  Admission Diagnoses:  Discharge Diagnoses:  Active Problems:   Biliary colic   Cholecystitis   Discharged Condition: good  Hospital Course: Ronnie Daniel came into the hospital through the ED with findings consistent with cholelithiasis but had inadequate pain control and a WBC.  He was brought in for observation, and an US was obtained demonstrating cholecystitis. He was taken to the OR for laparoscopic cholecystectomy and did well. Prior to his discharge, he tolerated a diet, had pain control, and was voiding and ambulating.   Consults: None  Significant Diagnostic Studies: US- Cholecystitis and no choledocholithiasis   Treatments: Laparoscopic cholecystectomy   Discharge Exam: Blood pressure 124/78, pulse 74, temperature 98.4 F (36.9 Daniel), temperature source Oral, resp. rate 16, height 5\' 6"  (1.676 m), weight 138 lb 3.7 oz (62.7 kg), SpO2 100 %. See physical from H&P of the same day.   Disposition: 01-Home or Self Care  Discharge Instructions    Call MD for:  difficulty breathing, headache or visual disturbances   Complete by:  As directed    Call MD for:  extreme fatigue   Complete by:  As directed    Call MD for:  persistant dizziness or light-headedness   Complete by:  As directed    Call MD for:  persistant nausea and vomiting   Complete by:  As directed    Call MD for:  redness, tenderness, or signs of infection (pain, swelling, redness, odor or green/yellow discharge around incision site)   Complete by:  As directed    Call MD for:  severe uncontrolled pain   Complete by:  As directed    Call MD for:  temperature >100.4   Complete by:  As directed    Diet - low sodium heart healthy   Complete by:  As directed    Increase activity slowly   Complete by:  As directed      Allergies as of 02/12/2017   No Known  Allergies     Medication List    TAKE these medications   docusate sodium 100 MG capsule Commonly known as:  COLACE Take 1 capsule (100 mg total) 2 (two) times daily by mouth.   oxyCODONE 5 MG immediate release tablet Commonly known as:  Oxy IR/ROXICODONE Take 1 tablet (5 mg total) every 4 (four) hours as needed by mouth for breakthrough pain.      Follow-up Information    Ronnie RoersBridges, Marissa Lowrey C, MD In 2 weeks.   Specialty:  General Surgery Contact information: [address] [place] [address] [phone]           Signed: Lucretia RoersLindsay Daniel Kalesha Daniel 02/13/2017, 7:59 AM

## 2018-11-19 IMAGING — CT CT ABD-PELV W/ CM
2 of 3 series · 15 of 46 positions shown, 17 images · IV contrast (Isovue)
Comparison: 04/17/2016 CT of the abdomen and pelvis and right upper
quadrant ultrasound.

CLINICAL DATA: 36 y/o M; worsening right upper quadrant abdominal
pain.

EXAM:
CT ABDOMEN AND PELVIS WITH CONTRAST
TECHNIQUE: Multidetector CT imaging of the abdomen and pelvis was performed
using the standard protocol following bolus administration of
intravenous contrast.
CONTRAST:  100mL [id number] IOPAMIDOL ([id number]) INJECTION 61%

[Series 2: axial st · axial · 0.64mm/px · z∈[-664,-239]mm · 12 of 99 slices shown, 14 images]
[im 7/99  soft-tissue]
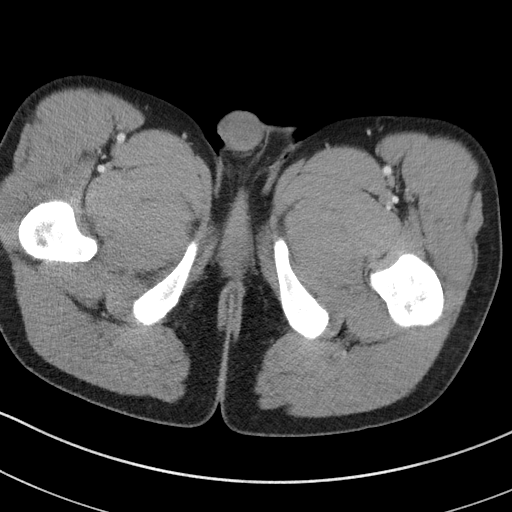
[im 7/99  bone]
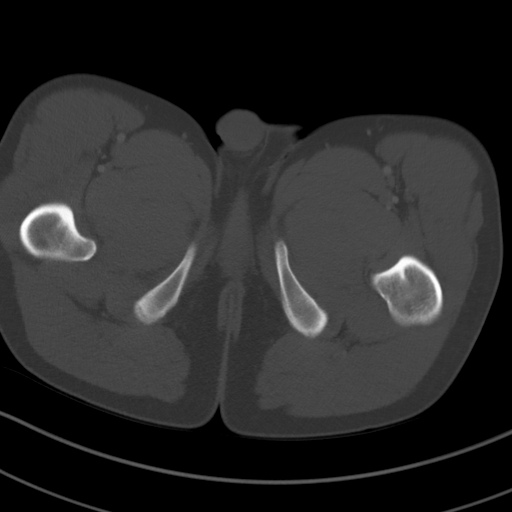
[im 13/99  soft-tissue]
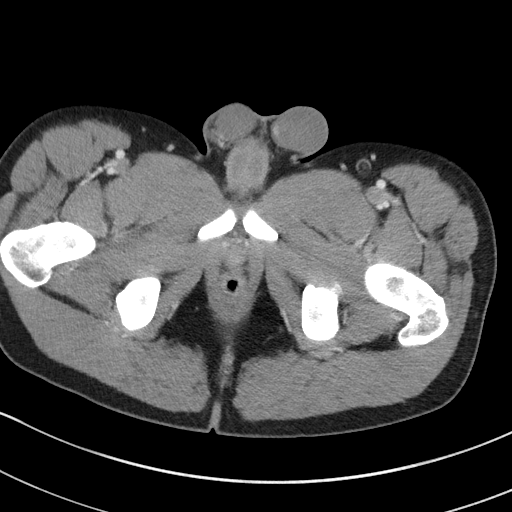
[im 23/99  soft-tissue]
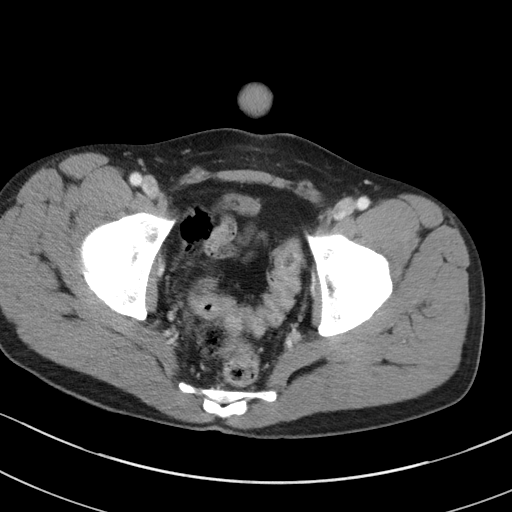
[im 29/99  soft-tissue]
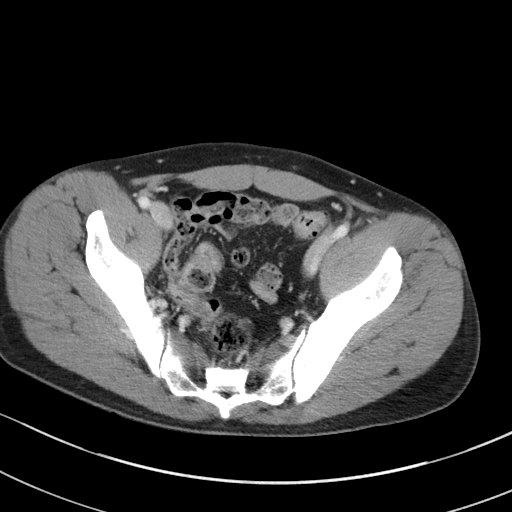
[im 38/99  soft-tissue]
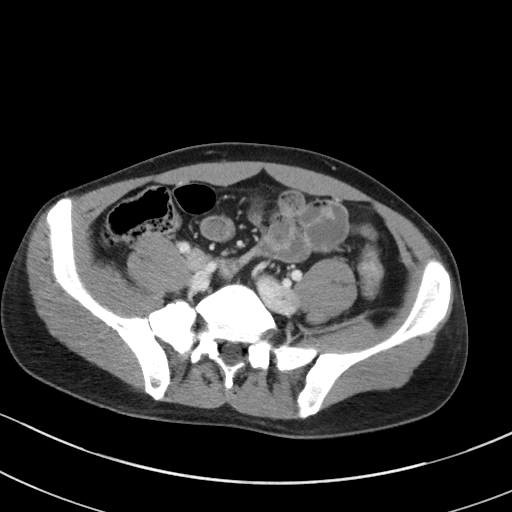
[im 45/99  soft-tissue]
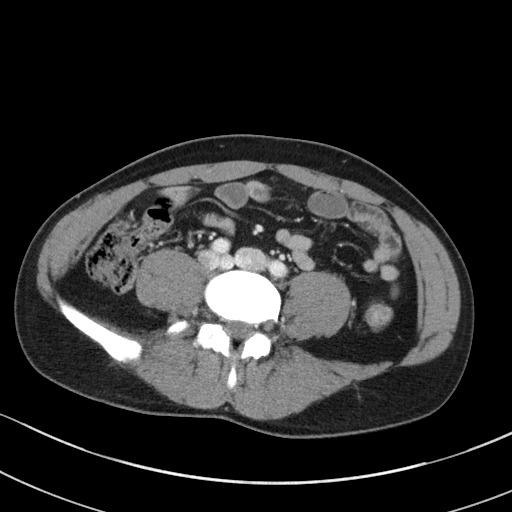
[im 54/99  soft-tissue]
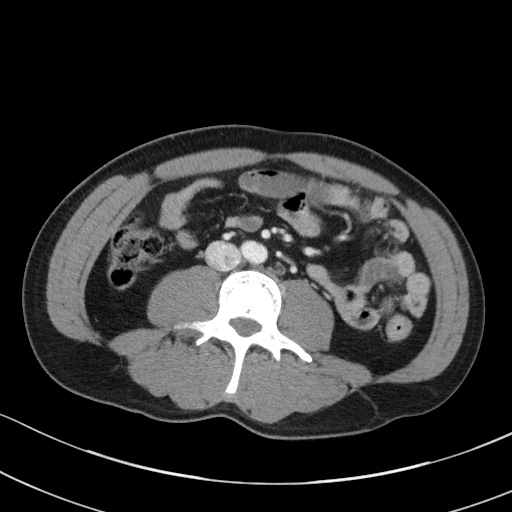
[im 61/99  soft-tissue]
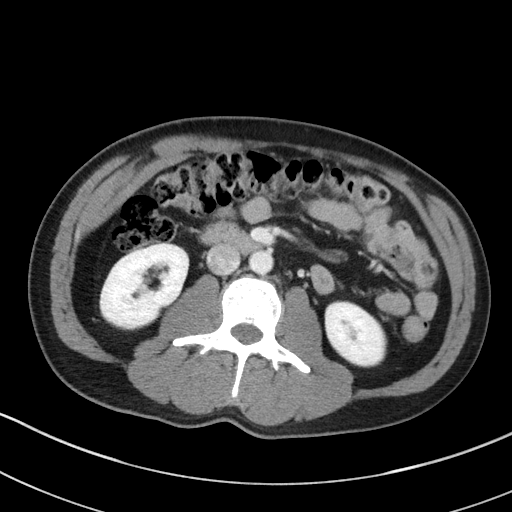
[im 70/99  soft-tissue]
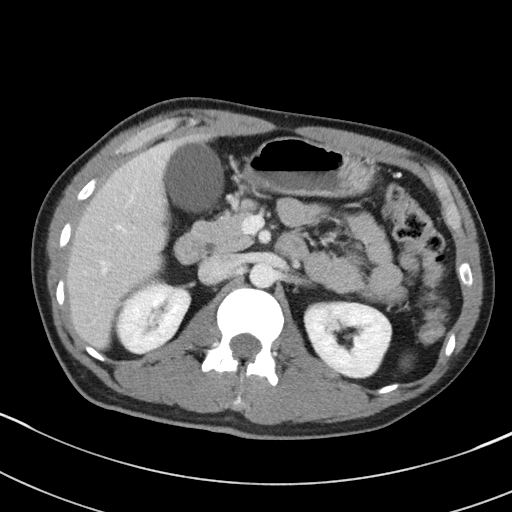
[im 70/99  bone]
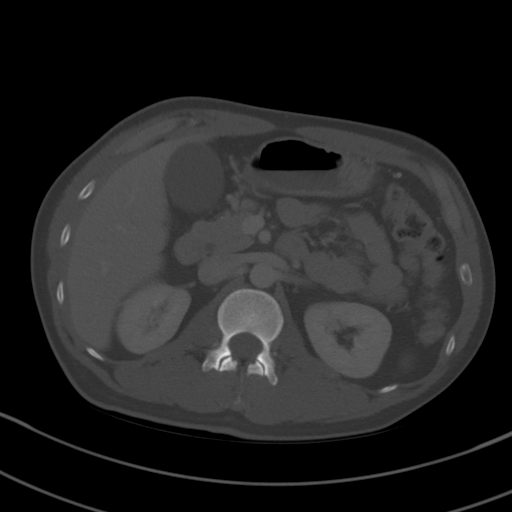
[im 76/99  soft-tissue]
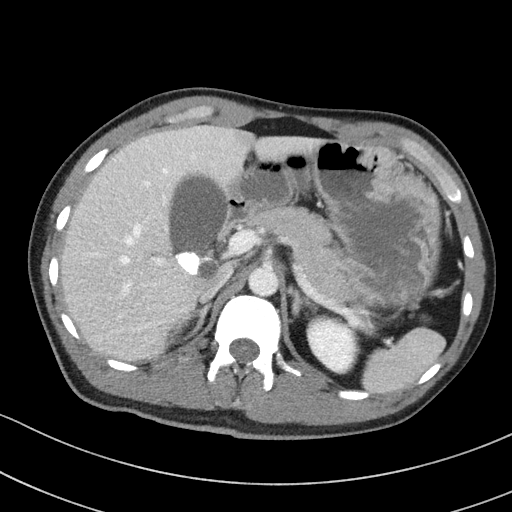
[im 86/99  soft-tissue]
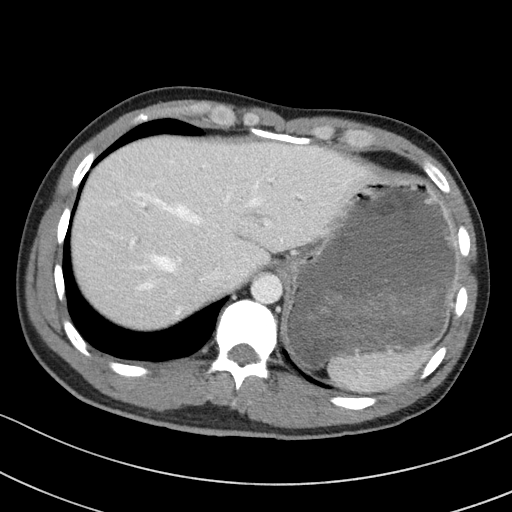
[im 92/99  soft-tissue]
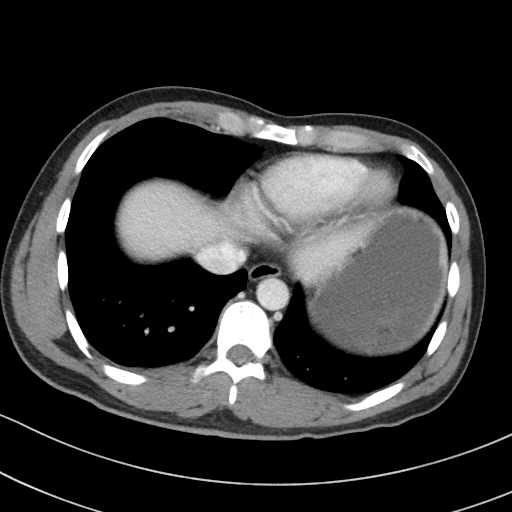

[Series 5: coronal st · coronal · 0.66mm/px · 3 of 90 slices shown]
[im 30/90  soft-tissue]
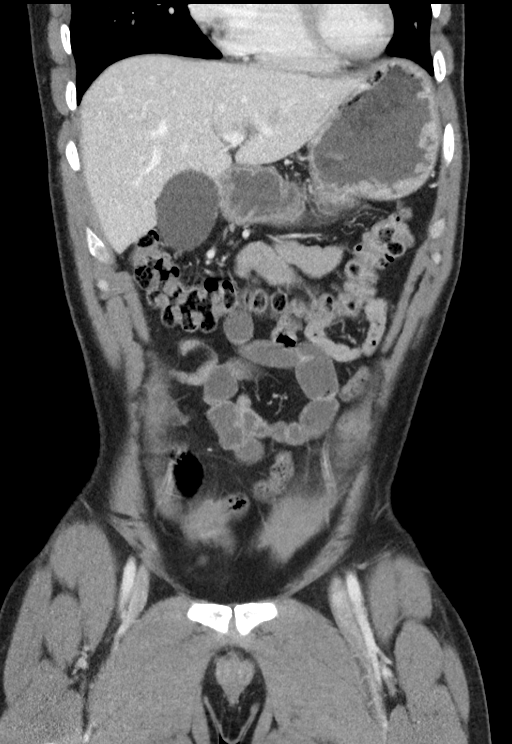
[im 40/90  soft-tissue]
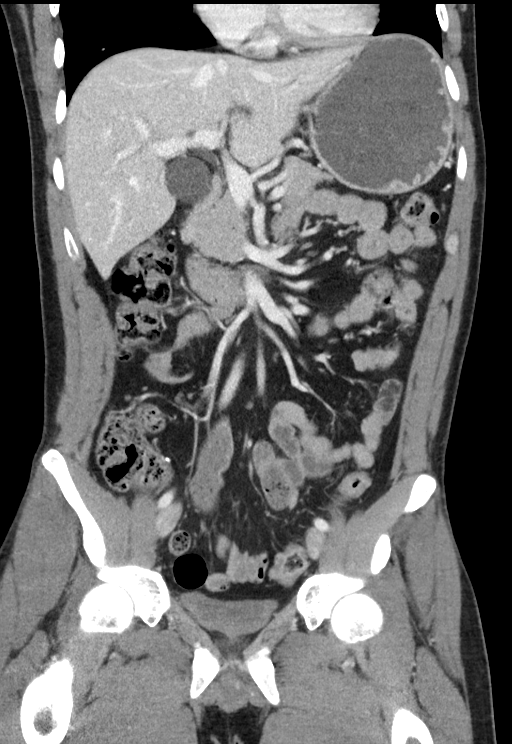
[im 50/90  soft-tissue]
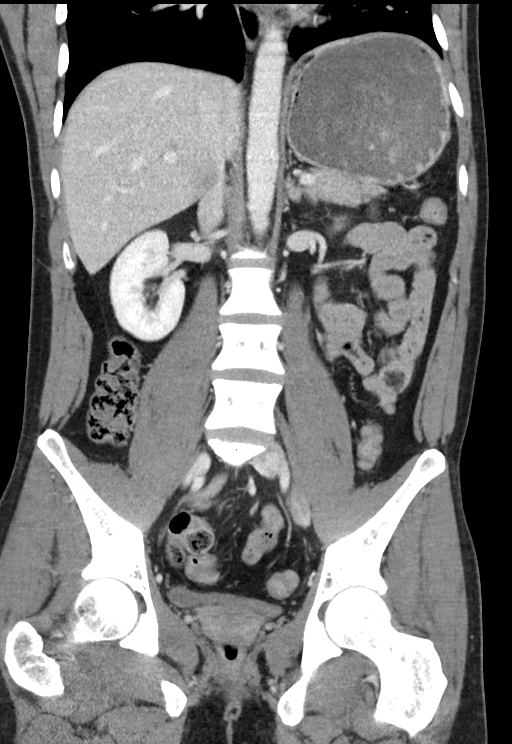

[15 of 46 positions shown; findings below may reference images not displayed]

FINDINGS: Lower chest: No acute abnormality.

Hepatobiliary: No focal liver lesion. Interval development of mild
intrahepatic biliary ductal dilatation. Common bile duct measures 7
mm which is also increased from prior CT and ultrasound. No
obstructing stone or mass is identified. No appreciable gallbladder
wall thickening or pericholecystic fluid. Interval increase in
gallbladder stones including within the neck of gallbladder.

Pancreas: Unremarkable. No pancreatic ductal dilatation or
surrounding inflammatory changes.

Spleen: Normal in size without focal abnormality.

Adrenals/Urinary Tract: Adrenal glands are unremarkable. Kidneys are
normal, without renal calculi, focal lesion, or hydronephrosis.
Bladder is unremarkable.

Stomach/Bowel: Stomach is within normal limits. Appendix appears
normal. No evidence of bowel wall thickening, distention, or
inflammatory changes.

Vascular/Lymphatic: No significant vascular findings are present. No
enlarged abdominal or pelvic lymph nodes.

Reproductive: Prostate is unremarkable.

Other: No abdominal wall hernia or abnormality. No abdominopelvic
ascites.

Musculoskeletal: No acute or significant osseous findings.
IMPRESSION: 1. Interval increase in cholelithiasis. No secondary signs of acute
cholecystitis.
2. Interval mild intrahepatic biliary ductal dilatation and size of
common bile duct measuring 7 mm. No obstructing stone or mass
identified. If clinically indicated consider MRI/MRCP of the abdomen
to further characterize.

By: Fendt Cocco M.D.

## 2019-02-24 IMAGING — US US ABDOMEN LIMITED
1 series · 14 of 25 positions shown · non-contrast
Comparison: CT 02/11/2017.  Ultrasound 04/17/2016.

CLINICAL DATA: Right upper quadrant abdominal pain for 1 day,
worsening after eating. Known gallstones. Previous appendectomy.

EXAM:
ULTRASOUND ABDOMEN LIMITED RIGHT UPPER QUADRANT

[Series 1: us abdomen limited · 0.22mm/px · 14 of 51 slices shown]
[im 1/51]
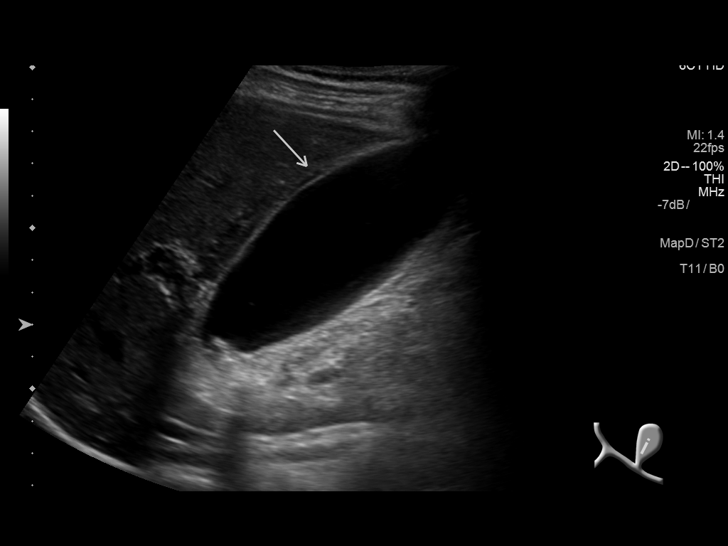
[im 5/51]
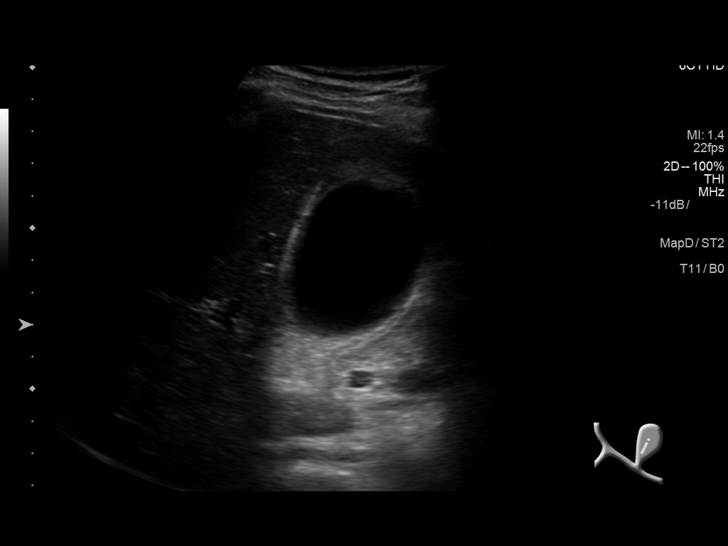
[im 9/51]
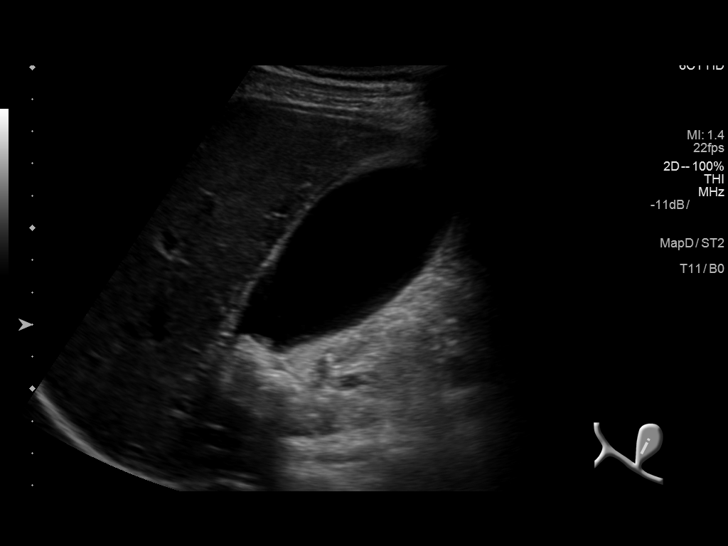
[im 13/51]
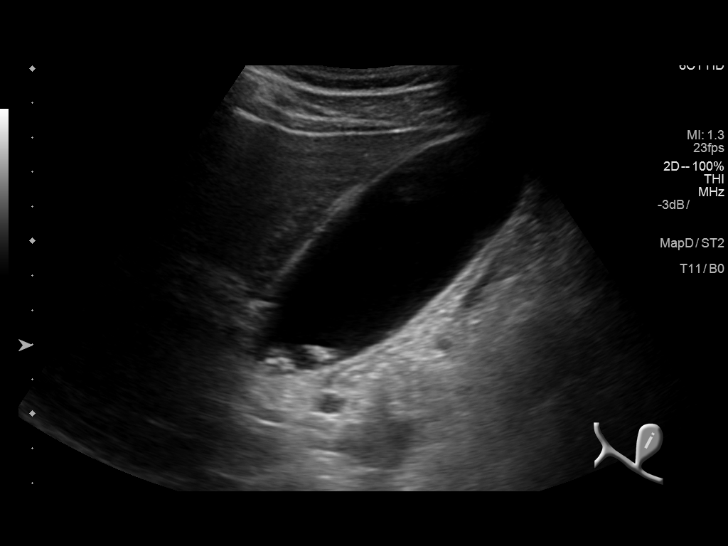
[im 17/51]
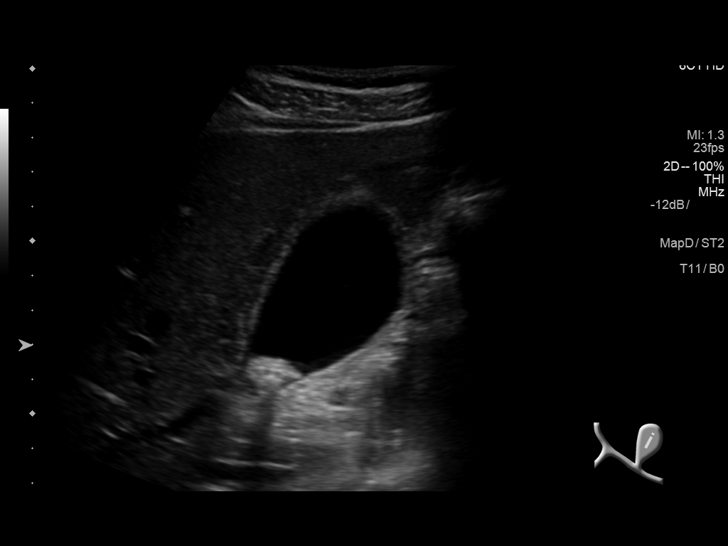
[im 19/51]
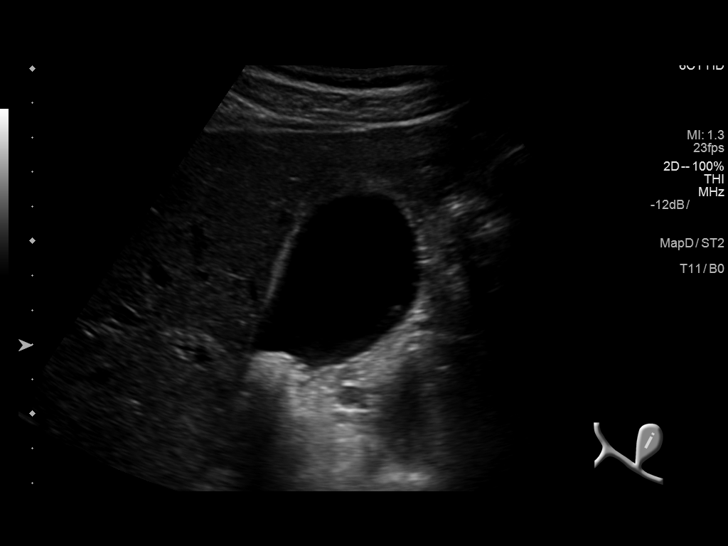
[im 23/51]
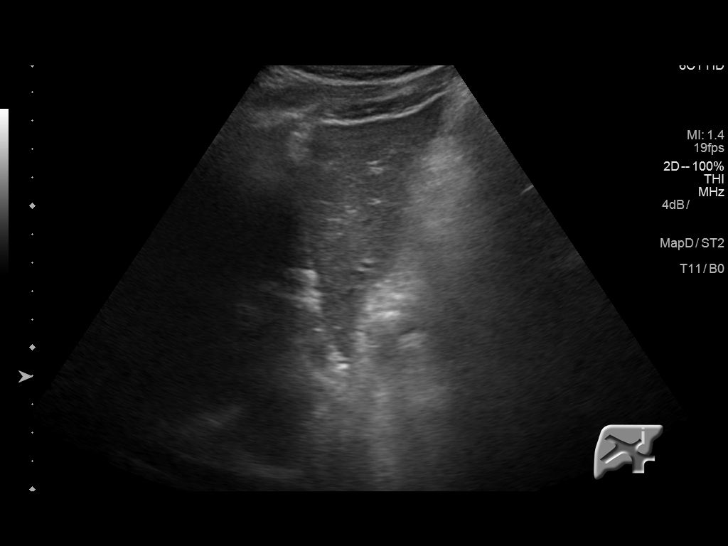
[im 28/51]
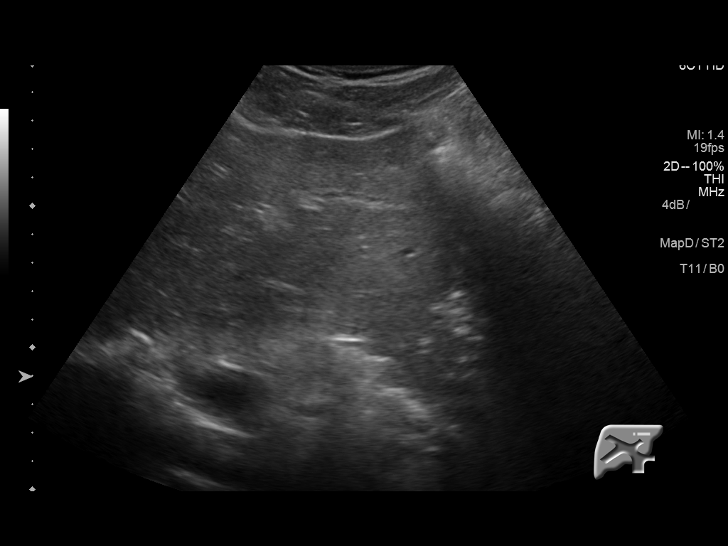
[im 32/51]
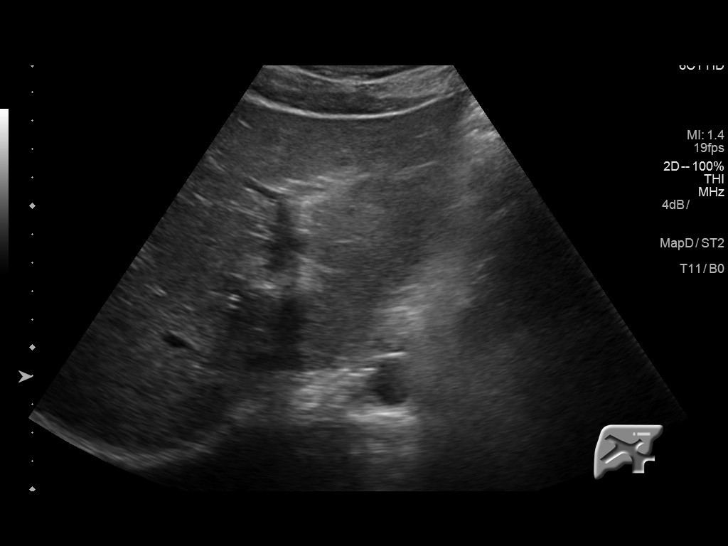
[im 34/51]
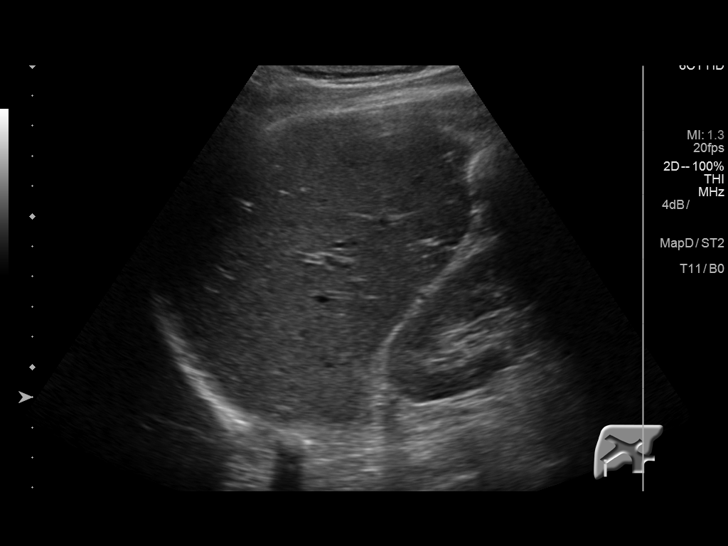
[im 38/51]
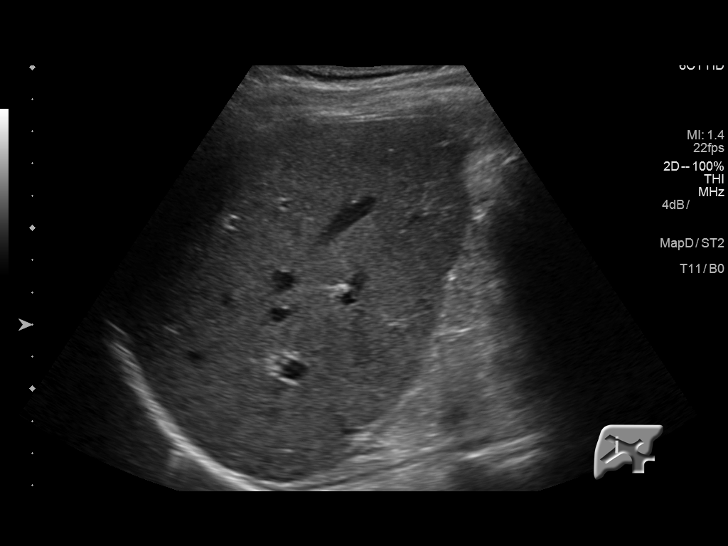
[im 42/51]
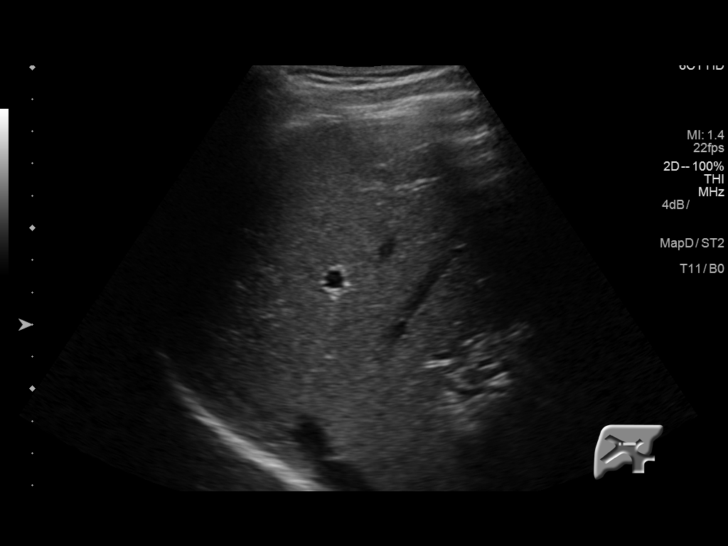
[im 46/51]
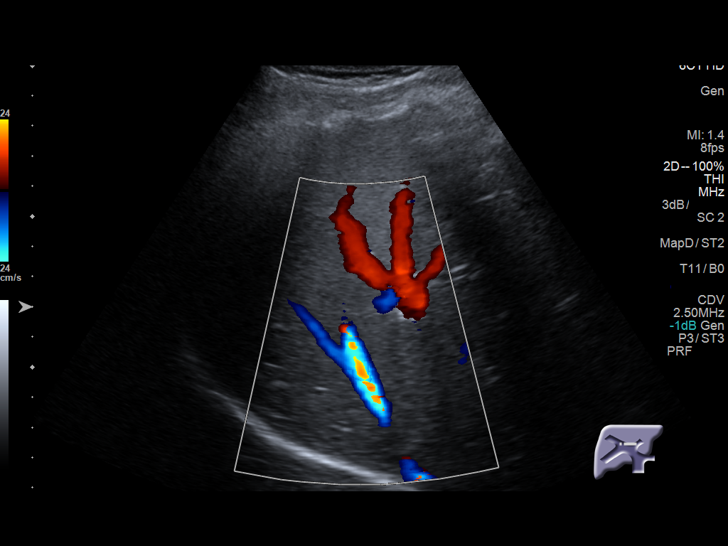
[im 51/51]
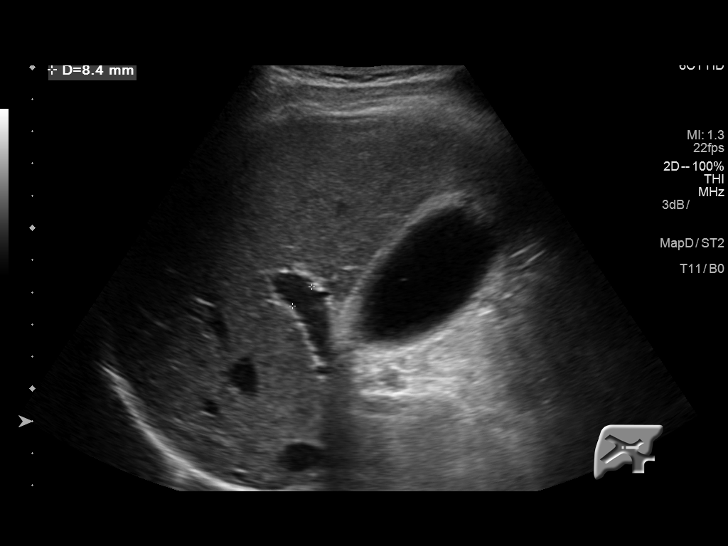

[14 of 25 positions shown; findings below may reference images not displayed]

FINDINGS: Gallbladder:

Cholelithiasis again noted with multiple stones in the gallbladder
neck. There is new mild gallbladder wall thickening to 4 mm. There
is trace pericholecystic fluid, and according to the sonographer,
the sonographic Murphy sign is positive.

Common bile duct:

Diameter: 7 mm.  No evidence of intraductal calculus.

Liver:

No focal lesion identified. Within normal limits in parenchymal
echogenicity. Portal vein is patent on color Doppler imaging with
normal direction of blood flow towards the liver.
IMPRESSION: 1. Cholelithiasis with new mild gallbladder wall thickening and a
positive sonographic Murphy's sign suspicious for early acute
cholecystitis.
2. New mild biliary dilatation as seen on earlier CT. Liver function
studies done today are within normal limits.
